# Patient Record
Sex: Female | Born: 1970 | Race: White | Hispanic: No | Smoking: Former smoker
Health system: Southern US, Community
[De-identification: ages and names within clinical notes are randomized; demographics above are authoritative.]

## PROBLEM LIST (undated history)

## (undated) DIAGNOSIS — R87619 Unspecified abnormal cytological findings in specimens from cervix uteri: Secondary | ICD-10-CM

## (undated) DIAGNOSIS — Z803 Family history of malignant neoplasm of breast: Secondary | ICD-10-CM

## (undated) DIAGNOSIS — R51 Headache: Secondary | ICD-10-CM

## (undated) DIAGNOSIS — Z8042 Family history of malignant neoplasm of prostate: Secondary | ICD-10-CM

## (undated) DIAGNOSIS — IMO0002 Reserved for concepts with insufficient information to code with codable children: Secondary | ICD-10-CM

## (undated) DIAGNOSIS — R5383 Other fatigue: Secondary | ICD-10-CM

## (undated) DIAGNOSIS — Z808 Family history of malignant neoplasm of other organs or systems: Secondary | ICD-10-CM

## (undated) HISTORY — PX: EYE SURGERY: SHX253

## (undated) HISTORY — DX: Family history of malignant neoplasm of prostate: Z80.42

## (undated) HISTORY — PX: STRABISMUS SURGERY: SHX218

## (undated) HISTORY — PX: DIAGNOSTIC LAPAROSCOPY: SUR761

## (undated) HISTORY — DX: Family history of malignant neoplasm of other organs or systems: Z80.8

## (undated) HISTORY — DX: Family history of malignant neoplasm of breast: Z80.3

## (undated) HISTORY — PX: BREAST BIOPSY: SHX20

## (undated) HISTORY — DX: Headache: R51

## (undated) HISTORY — DX: Reserved for concepts with insufficient information to code with codable children: IMO0002

## (undated) HISTORY — DX: Other fatigue: R53.83

## (undated) HISTORY — DX: Unspecified abnormal cytological findings in specimens from cervix uteri: R87.619

## (undated) HISTORY — PX: TONSILLECTOMY: SUR1361

---

## 1997-11-28 ENCOUNTER — Other Ambulatory Visit: Admission: RE | Admit: 1997-11-28 | Discharge: 1997-11-28 | Payer: Self-pay | Admitting: *Deleted

## 1998-04-23 ENCOUNTER — Other Ambulatory Visit: Admission: RE | Admit: 1998-04-23 | Discharge: 1998-04-23 | Payer: Self-pay | Admitting: *Deleted

## 1998-11-13 ENCOUNTER — Other Ambulatory Visit: Admission: RE | Admit: 1998-11-13 | Discharge: 1998-11-13 | Payer: Self-pay | Admitting: *Deleted

## 1999-10-07 ENCOUNTER — Other Ambulatory Visit: Admission: RE | Admit: 1999-10-07 | Discharge: 1999-10-07 | Payer: Self-pay | Admitting: Obstetrics and Gynecology

## 2000-04-15 ENCOUNTER — Other Ambulatory Visit: Admission: RE | Admit: 2000-04-15 | Discharge: 2000-04-15 | Payer: Self-pay | Admitting: *Deleted

## 2000-04-18 ENCOUNTER — Inpatient Hospital Stay (HOSPITAL_COMMUNITY): Admission: AD | Admit: 2000-04-18 | Discharge: 2000-04-20 | Payer: Self-pay | Admitting: Obstetrics and Gynecology

## 2000-04-21 ENCOUNTER — Encounter (HOSPITAL_COMMUNITY): Admission: RE | Admit: 2000-04-21 | Discharge: 2000-07-20 | Payer: Self-pay | Admitting: Obstetrics and Gynecology

## 2000-06-01 ENCOUNTER — Other Ambulatory Visit: Admission: RE | Admit: 2000-06-01 | Discharge: 2000-06-01 | Payer: Self-pay | Admitting: *Deleted

## 2000-07-22 ENCOUNTER — Encounter: Admission: RE | Admit: 2000-07-22 | Discharge: 2000-10-20 | Payer: Self-pay | Admitting: Obstetrics and Gynecology

## 2000-11-19 ENCOUNTER — Encounter: Admission: RE | Admit: 2000-11-19 | Discharge: 2000-12-19 | Payer: Self-pay | Admitting: Obstetrics and Gynecology

## 2000-11-22 ENCOUNTER — Other Ambulatory Visit: Admission: RE | Admit: 2000-11-22 | Discharge: 2000-11-22 | Payer: Self-pay | Admitting: Obstetrics and Gynecology

## 2000-12-20 ENCOUNTER — Encounter: Admission: RE | Admit: 2000-12-20 | Discharge: 2001-01-19 | Payer: Self-pay | Admitting: Obstetrics and Gynecology

## 2001-02-19 ENCOUNTER — Encounter: Admission: RE | Admit: 2001-02-19 | Discharge: 2001-03-21 | Payer: Self-pay | Admitting: Obstetrics and Gynecology

## 2001-03-22 ENCOUNTER — Encounter: Admission: RE | Admit: 2001-03-22 | Discharge: 2001-04-21 | Payer: Self-pay | Admitting: Obstetrics and Gynecology

## 2001-05-17 ENCOUNTER — Other Ambulatory Visit: Admission: RE | Admit: 2001-05-17 | Discharge: 2001-05-17 | Payer: Self-pay | Admitting: Obstetrics and Gynecology

## 2002-03-28 ENCOUNTER — Inpatient Hospital Stay (HOSPITAL_COMMUNITY): Admission: AD | Admit: 2002-03-28 | Discharge: 2002-03-28 | Payer: Self-pay | Admitting: Obstetrics and Gynecology

## 2002-03-29 ENCOUNTER — Encounter: Admission: RE | Admit: 2002-03-29 | Discharge: 2002-04-28 | Payer: Self-pay | Admitting: Obstetrics and Gynecology

## 2002-05-29 ENCOUNTER — Encounter: Admission: RE | Admit: 2002-05-29 | Discharge: 2002-06-28 | Payer: Self-pay | Admitting: Obstetrics and Gynecology

## 2002-07-13 ENCOUNTER — Other Ambulatory Visit: Admission: RE | Admit: 2002-07-13 | Discharge: 2002-07-13 | Payer: Self-pay | Admitting: *Deleted

## 2002-07-29 ENCOUNTER — Encounter: Admission: RE | Admit: 2002-07-29 | Discharge: 2002-08-28 | Payer: Self-pay | Admitting: Obstetrics and Gynecology

## 2002-08-29 ENCOUNTER — Encounter: Admission: RE | Admit: 2002-08-29 | Discharge: 2002-09-28 | Payer: Self-pay | Admitting: Obstetrics and Gynecology

## 2002-10-28 ENCOUNTER — Encounter: Admission: RE | Admit: 2002-10-28 | Discharge: 2002-11-27 | Payer: Self-pay | Admitting: Obstetrics and Gynecology

## 2002-12-28 ENCOUNTER — Encounter: Admission: RE | Admit: 2002-12-28 | Discharge: 2003-01-27 | Payer: Self-pay | Admitting: Obstetrics and Gynecology

## 2003-09-26 ENCOUNTER — Other Ambulatory Visit: Admission: RE | Admit: 2003-09-26 | Discharge: 2003-09-26 | Payer: Self-pay | Admitting: Obstetrics and Gynecology

## 2004-10-23 ENCOUNTER — Other Ambulatory Visit: Admission: RE | Admit: 2004-10-23 | Discharge: 2004-10-23 | Payer: Self-pay | Admitting: Obstetrics and Gynecology

## 2005-10-29 ENCOUNTER — Other Ambulatory Visit: Admission: RE | Admit: 2005-10-29 | Discharge: 2005-10-29 | Payer: Self-pay | Admitting: Obstetrics and Gynecology

## 2006-04-21 ENCOUNTER — Other Ambulatory Visit: Admission: RE | Admit: 2006-04-21 | Discharge: 2006-04-21 | Payer: Self-pay | Admitting: Obstetrics and Gynecology

## 2011-01-28 ENCOUNTER — Other Ambulatory Visit: Payer: Self-pay | Admitting: Obstetrics and Gynecology

## 2011-01-28 DIAGNOSIS — Z1231 Encounter for screening mammogram for malignant neoplasm of breast: Secondary | ICD-10-CM

## 2011-02-10 ENCOUNTER — Ambulatory Visit
Admission: RE | Admit: 2011-02-10 | Discharge: 2011-02-10 | Disposition: A | Discharge status: Home / Self Care | Payer: BC Managed Care – PPO | Source: Ambulatory Visit | Attending: Obstetrics and Gynecology | Admitting: Obstetrics and Gynecology

## 2011-02-10 DIAGNOSIS — Z1231 Encounter for screening mammogram for malignant neoplasm of breast: Secondary | ICD-10-CM

## 2012-02-10 ENCOUNTER — Other Ambulatory Visit: Payer: Self-pay | Admitting: Obstetrics and Gynecology

## 2012-02-10 DIAGNOSIS — Z1231 Encounter for screening mammogram for malignant neoplasm of breast: Secondary | ICD-10-CM

## 2012-02-16 ENCOUNTER — Ambulatory Visit (INDEPENDENT_AMBULATORY_CARE_PROVIDER_SITE_OTHER): Payer: BC Managed Care – PPO

## 2012-02-16 DIAGNOSIS — Z1231 Encounter for screening mammogram for malignant neoplasm of breast: Secondary | ICD-10-CM

## 2012-03-01 ENCOUNTER — Ambulatory Visit (INDEPENDENT_AMBULATORY_CARE_PROVIDER_SITE_OTHER): Payer: BC Managed Care – PPO | Admitting: Obstetrics and Gynecology

## 2012-03-01 ENCOUNTER — Encounter: Payer: Self-pay | Admitting: Obstetrics and Gynecology

## 2012-03-01 VITALS — BP 96/62 | Resp 16 | Ht 62.5 in | Wt 142.0 lb

## 2012-03-01 DIAGNOSIS — Z124 Encounter for screening for malignant neoplasm of cervix: Secondary | ICD-10-CM

## 2012-03-01 NOTE — Progress Notes (Signed)
Regular Periods: no Mammogram: yes  Monthly Breast Ex.: no Exercise: yes  Tetanus < 10 years: yes Seatbelts: yes  NI. Bladder Functn.: yes Abuse at home: no  Daily BM's: yes Stressful Work: yes "Sometimes"   Healthy Diet: yes Sigmoid-Colonoscopy: Never  Calcium: no Medical problems this year: None   LAST PAP:01/2009 "WNL"  Contraception: Mirena  Mammogram:  02/2012 pt has not got Results back yet.   PCP: N/A  PMH: No changes  FMH: No Changes  Last Bone Scan: Never   ANNUAL GYNECOLOGIC EXAMINATION   Anna Reid is a 41 y.o. female, No obstetric history on file., who presents for an annual exam. See above. The patient had a Mirena IUD placed in 2011.  She is not sexually active.  She does not have cycles.  Prior Hysterectomy: No    History   Social History  . Marital Status: Divorced    Spouse Name: N/A    Number of Children: N/A  . Years of Education: N/A   Social History Main Topics  . Smoking status: Former Games developer  . Smokeless tobacco: Never Used  . Alcohol Use: No  . Drug Use: No  . Sexually Active: None   Other Topics Concern  . None   Social History Narrative  . None    Menstrual cycle:   LMP: No LMP recorded. Patient is not currently having periods (Reason: IUD).             The following portions of the patient's history were reviewed and updated as appropriate: allergies, current medications, past family history, past medical history, past social history, past surgical history and problem list.  Review of Systems Pertinent items are noted in HPI. Breast:Negative for breast lump,nipple discharge or nipple retraction Gastrointestinal: Negative for abdominal pain, change in bowel habits or rectal bleeding Urinary: some stress urinary incontinence   Objective:    BP 96/62  Resp 16  Ht 5' 2.5" (1.588 m)  Wt 142 lb (64.411 kg)  BMI 25.56 kg/m2    Weight:  Wt Readings from Last 1 Encounters:  03/01/12 142 lb (64.411 kg)          BMI: Body  mass index is 25.56 kg/(m^2).  General Appearance: Alert, appropriate appearance for age. No acute distress HEENT: Grossly normal Neck / Thyroid: Supple, no masses, nodes or enlargement Lungs: clear to auscultation bilaterally Back: No CVA tenderness Breast Exam: No masses or nodes.No dimpling, nipple retraction or discharge. Cardiovascular: Regular rate and rhythm. S1, S2, no murmur Gastrointestinal: Soft, non-tender, no masses or organomegaly  ++++++++++++++++++++++++++++++++++++++++++++++++++++++++  Pelvic Exam: External genitalia: normal general appearance Vaginal: normal without tenderness, induration or masses and relaxation: Yes Cervix: normal appearance Adnexa: normal bimanual exam Uterus: normal size, shape, and consistency Rectovaginal: normal rectal, no masses  ++++++++++++++++++++++++++++++++++++++++++++++++++++++++  Lymphatic Exam: Non-palpable nodes in neck, clavicular, axillary, or inguinal regions Neurologic: Normal speech, no tremor  Psychiatric: Alert and oriented, appropriate affect.   Wet Prep:   not applicable Urinalysis:  not applicable UPT:           Not done   Assessment:    Normal gyn exam   Overweight or obese: Yes   Pelvic relaxation: Yes  Plan:    mammogram pap smear return annually or prn Contraception:IUD    Medications prescribed: none  STD screen request: No   The updated Pap smear screening guidelines were discussed with the patient. The patient requested that I obtain a Pap smear: Yes.  Kegel exercises discussed: Yes.  Proper diet and regular exercise were reviewed.  Annual mammograms recommended starting at age 46. Proper breast care was discussed.  Regular health maintenance was reviewed.  Sleep hygiene was discussed.  Mylinda Latina.D.

## 2012-03-03 LAB — PAP IG W/ RFLX HPV ASCU

## 2012-03-03 NOTE — Progress Notes (Signed)
Quick Note:  Send atypical Pap smear letter with a note to return for repeat Pap in six months. ______ 

## 2012-03-08 ENCOUNTER — Encounter: Payer: Self-pay | Admitting: Obstetrics and Gynecology

## 2012-04-04 ENCOUNTER — Telehealth: Payer: Self-pay | Admitting: Obstetrics and Gynecology

## 2012-04-04 NOTE — Telephone Encounter (Signed)
Patient called.  HPV discussed.  Told to schedule colposcopy.  Dr. Stefano Gaul

## 2012-04-05 ENCOUNTER — Telehealth: Payer: Self-pay | Admitting: Obstetrics and Gynecology

## 2012-04-05 NOTE — Telephone Encounter (Signed)
Spoke with pt rgd msg explained colpo to pt pt has appt 04/25/12 at 10:15 with AVS pt voice understanding

## 2012-04-25 ENCOUNTER — Ambulatory Visit (INDEPENDENT_AMBULATORY_CARE_PROVIDER_SITE_OTHER): Payer: BC Managed Care – PPO | Admitting: Obstetrics and Gynecology

## 2012-04-25 ENCOUNTER — Encounter: Payer: Self-pay | Admitting: Obstetrics and Gynecology

## 2012-04-25 VITALS — BP 108/64 | Resp 16 | Ht 62.5 in | Wt 148.0 lb

## 2012-04-25 DIAGNOSIS — B977 Papillomavirus as the cause of diseases classified elsewhere: Secondary | ICD-10-CM

## 2012-04-25 DIAGNOSIS — R6889 Other general symptoms and signs: Secondary | ICD-10-CM

## 2012-04-25 DIAGNOSIS — R5383 Other fatigue: Secondary | ICD-10-CM | POA: Insufficient documentation

## 2012-04-25 LAB — POCT URINE PREGNANCY: Preg Test, Ur: NEGATIVE

## 2012-04-25 NOTE — Progress Notes (Addendum)
HISTORY OF PRESENT ILLNESS  Anna Reid is a 41 y.o. year old female,G2P2, who presents for a problem visit. The patient has a history of abnormal Pap smears.  Colposcopy and biopsies were performed in 2007. CIN I was diagnosed.  Subjective:  Questions HPV.  Objective:  BP 108/64  Resp 16  Ht 5' 2.5" (1.588 m)  Wt 148 lb (67.132 kg)  BMI 26.64 kg/m2   GI: soft and nontender  External genitalia: normal general appearance Vaginal: normal without tenderness, induration or masses and relaxation noted Cervix: see colposcopy note Adnexa: normal bimanual exam Uterus: upper limits normal size  Urine pregnancy test: Negative  COLPOSCOPY NOTE:  The colposcopy procedure was explained.  The patient's questions were answered. A speculum exam was performed.  The cervix was prepped with acetic acid and Hurricaine gel.  The cervix was evaluated using a white light and the green filter. Findings: punctations at 6:00, and white epithelium at 1:00 .  The endocervical canal was clear.  No lesions were seen.  Biopsies obtained: 6:00 and 1:00.  Hemostasis was adequate.  An endocervical curettage was performed.  Again, hemostasis was adequate. The procedure was terminated.  The patient tolerated her procedure well.  The specimens were sent to pathology.  Assessment:  Ascus Pap.  HPV.  History of CIN-1  Plan:  Biopsy at 6:00, 1:00, and ECC sent to pathology.  HPV discussed  Return to office in 2 week(s).   Leonard Schwartz M.D.  04/25/2012 11:06 AM    Previous Pap Smear: EPITHELIAL CELL ABNORMALITY: SQUAMOUS CELLS ATYPICAL SQUAMOUS CELLS OF UNDETERMINED SIGNIFICANCE (ASC-US).  Previous Colposcopy: 2007 Referred From: [facility]  LMP: No cycles Contraception: Mirena G,P: 2;2

## 2012-04-27 LAB — PATHOLOGY

## 2012-05-03 ENCOUNTER — Telehealth: Payer: Self-pay | Admitting: Obstetrics and Gynecology

## 2012-05-06 ENCOUNTER — Telehealth: Payer: Self-pay

## 2012-05-06 NOTE — Telephone Encounter (Signed)
Message routed to EP regarding pt calling for her results. AVS has not reviewed these results. Not sure who pt spoke to on Tues. No documentation is noted that anyone told her that would let AVS know she was awaiting results. Will try to see if EP, PA can review pt results.  [facility] CMA

## 2012-05-06 NOTE — Telephone Encounter (Signed)
LM for pt to c/b regarding her message. 

## 2012-05-06 NOTE — Telephone Encounter (Signed)
Spoke with pt requesting test results. Informed pt per protocol the provider has to sign off on the results before we can release them. Pt upset states someone told her Tuesday they will send Dr. AVS a message stating she was waiting for lab results but hasn't heard anything yet; however, there isn't any notes indicating that. Informed pt I will consult with his assistant regarding this issue.

## 2012-05-07 NOTE — Telephone Encounter (Signed)
Patient with recent colposcopy, is scheduled to follow up with Dr. Stefano Gaul on 05/11/12.  Though her colposcopy biopsies did not show any cancer, she will need to discuss findings with Dr. Stefano Gaul and arrange for follow up.  Auren Valdes, PA-C

## 2012-05-11 ENCOUNTER — Ambulatory Visit (INDEPENDENT_AMBULATORY_CARE_PROVIDER_SITE_OTHER): Payer: BC Managed Care – PPO | Admitting: Obstetrics and Gynecology

## 2012-05-11 ENCOUNTER — Encounter: Payer: Self-pay | Admitting: Obstetrics and Gynecology

## 2012-05-11 VITALS — BP 120/70 | Ht 62.0 in | Wt 146.0 lb

## 2012-05-11 DIAGNOSIS — N870 Mild cervical dysplasia: Secondary | ICD-10-CM

## 2012-05-11 DIAGNOSIS — B977 Papillomavirus as the cause of diseases classified elsewhere: Secondary | ICD-10-CM

## 2012-05-11 NOTE — Progress Notes (Signed)
HISTORY OF PRESENT ILLNESS  [name] is a 41 y.o. year old female,G2P2, who presents for a problem visit. The patient has a history of recurrent ASCUS Pap smears.  Her HPV test is positive high-risk virus.  Colposcopy and biopsies were performed that showed CIN-1 and HPV.  Subjective:  The patient is emotionally upset today.  She is crying during the discussion.  She says she is upset about her diagnosis.  Objective:  BP 120/70  Ht 5\' 2"  (1.575 m)  Wt 146 lb (66.225 kg)  BMI 26.70 kg/m2   General: moderate distress and very emotional  Exam deferred.  Assessment:  CIN-1  High-risk HPV  Emotionally upset  Plan:  The natural history of CIN and HPV were discussed again.  We discussed her current home situation.  The patient elects to repeat her Pap smear at her next annual exam.  The patient was told to call if anything else bothers her so that we can discuss how she is doing.  25 min. Office visit with greater than 50% of the visit being face-to-face with the patient.  Leonard Schwartz M.D.  05/11/2012 9:37 AM

## 2013-02-20 ENCOUNTER — Other Ambulatory Visit: Payer: Self-pay | Admitting: Obstetrics and Gynecology

## 2013-02-20 DIAGNOSIS — Z1231 Encounter for screening mammogram for malignant neoplasm of breast: Secondary | ICD-10-CM

## 2013-02-28 ENCOUNTER — Ambulatory Visit (INDEPENDENT_AMBULATORY_CARE_PROVIDER_SITE_OTHER): Payer: BC Managed Care – PPO

## 2013-02-28 DIAGNOSIS — Z1231 Encounter for screening mammogram for malignant neoplasm of breast: Secondary | ICD-10-CM

## 2014-01-29 ENCOUNTER — Other Ambulatory Visit: Payer: Self-pay | Admitting: Obstetrics and Gynecology

## 2014-01-29 DIAGNOSIS — Z139 Encounter for screening, unspecified: Secondary | ICD-10-CM

## 2014-03-06 ENCOUNTER — Ambulatory Visit: Payer: Self-pay

## 2014-03-06 ENCOUNTER — Ambulatory Visit (INDEPENDENT_AMBULATORY_CARE_PROVIDER_SITE_OTHER): Payer: BC Managed Care – PPO

## 2014-03-06 DIAGNOSIS — Z1231 Encounter for screening mammogram for malignant neoplasm of breast: Secondary | ICD-10-CM

## 2014-03-06 DIAGNOSIS — Z139 Encounter for screening, unspecified: Secondary | ICD-10-CM

## 2014-05-07 ENCOUNTER — Encounter: Payer: Self-pay | Admitting: Obstetrics and Gynecology

## 2015-02-14 ENCOUNTER — Other Ambulatory Visit: Payer: Self-pay | Admitting: Obstetrics and Gynecology

## 2015-02-14 DIAGNOSIS — Z1231 Encounter for screening mammogram for malignant neoplasm of breast: Secondary | ICD-10-CM

## 2015-03-12 ENCOUNTER — Ambulatory Visit (INDEPENDENT_AMBULATORY_CARE_PROVIDER_SITE_OTHER): Payer: BLUE CROSS/BLUE SHIELD

## 2015-03-12 DIAGNOSIS — Z1231 Encounter for screening mammogram for malignant neoplasm of breast: Secondary | ICD-10-CM

## 2015-07-07 DIAGNOSIS — C50919 Malignant neoplasm of unspecified site of unspecified female breast: Secondary | ICD-10-CM

## 2015-07-07 HISTORY — DX: Malignant neoplasm of unspecified site of unspecified female breast: C50.919

## 2016-02-10 ENCOUNTER — Other Ambulatory Visit: Payer: Self-pay | Admitting: Obstetrics and Gynecology

## 2016-02-10 DIAGNOSIS — Z139 Encounter for screening, unspecified: Secondary | ICD-10-CM

## 2016-03-18 ENCOUNTER — Ambulatory Visit (INDEPENDENT_AMBULATORY_CARE_PROVIDER_SITE_OTHER): Payer: BLUE CROSS/BLUE SHIELD

## 2016-03-18 DIAGNOSIS — Z139 Encounter for screening, unspecified: Secondary | ICD-10-CM

## 2016-03-18 DIAGNOSIS — Z1231 Encounter for screening mammogram for malignant neoplasm of breast: Secondary | ICD-10-CM

## 2016-03-25 ENCOUNTER — Other Ambulatory Visit: Payer: Self-pay | Admitting: Obstetrics and Gynecology

## 2016-03-25 DIAGNOSIS — R928 Other abnormal and inconclusive findings on diagnostic imaging of breast: Secondary | ICD-10-CM

## 2016-03-27 ENCOUNTER — Ambulatory Visit
Admission: RE | Admit: 2016-03-27 | Discharge: 2016-03-27 | Disposition: A | Discharge status: Home / Self Care | Payer: BLUE CROSS/BLUE SHIELD | Source: Ambulatory Visit | Attending: Obstetrics and Gynecology | Admitting: Obstetrics and Gynecology

## 2016-03-27 ENCOUNTER — Other Ambulatory Visit: Payer: Self-pay | Admitting: Obstetrics and Gynecology

## 2016-03-27 DIAGNOSIS — R928 Other abnormal and inconclusive findings on diagnostic imaging of breast: Secondary | ICD-10-CM

## 2016-03-31 ENCOUNTER — Ambulatory Visit
Admission: RE | Admit: 2016-03-31 | Discharge: 2016-03-31 | Disposition: A | Discharge status: Home / Self Care | Payer: BLUE CROSS/BLUE SHIELD | Source: Ambulatory Visit | Attending: Obstetrics and Gynecology | Admitting: Obstetrics and Gynecology

## 2016-03-31 DIAGNOSIS — R928 Other abnormal and inconclusive findings on diagnostic imaging of breast: Secondary | ICD-10-CM

## 2016-04-01 ENCOUNTER — Telehealth: Payer: Self-pay | Admitting: *Deleted

## 2016-04-01 DIAGNOSIS — C50211 Malignant neoplasm of upper-inner quadrant of right female breast: Secondary | ICD-10-CM | POA: Insufficient documentation

## 2016-04-01 NOTE — Telephone Encounter (Signed)
Confirmed [facility] for 04/08/16 at 815am .  Instructions and contact information given.

## 2016-04-08 ENCOUNTER — Ambulatory Visit (HOSPITAL_BASED_OUTPATIENT_CLINIC_OR_DEPARTMENT_OTHER): Payer: BLUE CROSS/BLUE SHIELD | Admitting: Hematology and Oncology

## 2016-04-08 ENCOUNTER — Other Ambulatory Visit: Payer: BLUE CROSS/BLUE SHIELD

## 2016-04-08 ENCOUNTER — Ambulatory Visit: Payer: BLUE CROSS/BLUE SHIELD | Admitting: Physical Therapy

## 2016-04-08 ENCOUNTER — Encounter: Payer: Self-pay | Admitting: Genetic Counselor

## 2016-04-08 ENCOUNTER — Ambulatory Visit
Admission: RE | Admit: 2016-04-08 | Discharge: 2016-04-08 | Disposition: A | Discharge status: Home / Self Care | Payer: BLUE CROSS/BLUE SHIELD | Source: Ambulatory Visit | Attending: Radiation Oncology | Admitting: Radiation Oncology

## 2016-04-08 ENCOUNTER — Ambulatory Visit (HOSPITAL_BASED_OUTPATIENT_CLINIC_OR_DEPARTMENT_OTHER): Payer: BLUE CROSS/BLUE SHIELD | Admitting: Genetic Counselor

## 2016-04-08 ENCOUNTER — Encounter: Payer: Self-pay | Admitting: Hematology and Oncology

## 2016-04-08 ENCOUNTER — Encounter: Payer: Self-pay | Admitting: *Deleted

## 2016-04-08 ENCOUNTER — Other Ambulatory Visit (HOSPITAL_BASED_OUTPATIENT_CLINIC_OR_DEPARTMENT_OTHER): Payer: BLUE CROSS/BLUE SHIELD

## 2016-04-08 DIAGNOSIS — Z170 Estrogen receptor positive status [ER+]: Secondary | ICD-10-CM

## 2016-04-08 DIAGNOSIS — D0511 Intraductal carcinoma in situ of right breast: Secondary | ICD-10-CM | POA: Diagnosis not present

## 2016-04-08 DIAGNOSIS — C50211 Malignant neoplasm of upper-inner quadrant of right female breast: Secondary | ICD-10-CM

## 2016-04-08 DIAGNOSIS — Z803 Family history of malignant neoplasm of breast: Secondary | ICD-10-CM

## 2016-04-08 DIAGNOSIS — Z8042 Family history of malignant neoplasm of prostate: Secondary | ICD-10-CM | POA: Diagnosis not present

## 2016-04-08 DIAGNOSIS — Z801 Family history of malignant neoplasm of trachea, bronchus and lung: Secondary | ICD-10-CM | POA: Diagnosis not present

## 2016-04-08 DIAGNOSIS — Z808 Family history of malignant neoplasm of other organs or systems: Secondary | ICD-10-CM

## 2016-04-08 LAB — CBC WITH DIFFERENTIAL/PLATELET
BASO%: 0.7 % (ref 0.0–2.0)
Basophils Absolute: 0 10*3/uL (ref 0.0–0.1)
EOS ABS: 0.2 10*3/uL (ref 0.0–0.5)
EOS%: 4.3 % (ref 0.0–7.0)
HCT: 42.7 % (ref 34.8–46.6)
HEMOGLOBIN: 14.1 g/dL (ref 11.6–15.9)
LYMPH#: 1.3 10*3/uL (ref 0.9–3.3)
LYMPH%: 34.6 % (ref 14.0–49.7)
MCH: 30.3 pg (ref 25.1–34.0)
MCHC: 33.1 g/dL (ref 31.5–36.0)
MCV: 91.5 fL (ref 79.5–101.0)
MONO#: 0.4 10*3/uL (ref 0.1–0.9)
MONO%: 9.3 % (ref 0.0–14.0)
NEUT%: 51.1 % (ref 38.4–76.8)
NEUTROS ABS: 2 10*3/uL (ref 1.5–6.5)
Platelets: 205 10*3/uL (ref 145–400)
RBC: 4.67 10*6/uL (ref 3.70–5.45)
RDW: 12.7 % (ref 11.2–14.5)
WBC: 3.8 10*3/uL — AB (ref 3.9–10.3)

## 2016-04-08 LAB — COMPREHENSIVE METABOLIC PANEL
ALBUMIN: 4.1 g/dL (ref 3.5–5.0)
ALK PHOS: 76 U/L (ref 40–150)
ALT: <9 U/L (ref 0–55)
AST: 15 U/L (ref 5–34)
Anion Gap: 10 mEq/L (ref 3–11)
BILIRUBIN TOTAL: 0.7 mg/dL (ref 0.20–1.20)
BUN: 16.3 mg/dL (ref 7.0–26.0)
CO2: 27 mEq/L (ref 22–29)
Calcium: 9.4 mg/dL (ref 8.4–10.4)
Chloride: 104 mEq/L (ref 98–109)
Creatinine: 1 mg/dL (ref 0.6–1.1)
EGFR: 72 mL/min/{1.73_m2} — AB (ref >90)
GLUCOSE: 91 mg/dL (ref 70–140)
Potassium: 4.2 mEq/L (ref 3.5–5.1)
SODIUM: 141 meq/L (ref 136–145)
TOTAL PROTEIN: 7.3 g/dL (ref 6.4–8.3)

## 2016-04-08 NOTE — Progress Notes (Signed)
Radiation Oncology         [phone] ________________________________  Name: Anna Reid MRN: [id number]  Date: 04/08/2016  DOB: 1970/10/18  CC:Pcp Not In System  No ref. provider found     REFERRING PHYSICIAN: No ref. provider found   DIAGNOSIS: There were no encounter diagnoses.   HISTORY OF PRESENT ILLNESS: Anna Reid is a 45 y.o. female seen in the multidisciplinary breast clinic for a new right breast cancer. The patient was found to have right sided calcifications on a screening mammogram on 03/18/16. She returned for recall diagnostic imaging on 03/27/16 and an ultrasound revealed a 4 mm area of calcifications. No axillary ultrasound was performed. 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 She comes today to discuss treatment options for her care, and specifically with Dr. Lisbeth Renshaw regarding radiotherapy.   PREVIOUS RADIATION THERAPY: No   PAST MEDICAL HISTORY:  Past Medical History:  Diagnosis Date  . Abnormal Pap smear    ASC-US  . Fatigue   . Headache(784.0)        PAST SURGICAL HISTORY: Past Surgical History:  Procedure Laterality Date  . STRABISMUS SURGERY    . TONSILLECTOMY       FAMILY HISTORY:  Family History  Problem Relation Age of Onset  . Brain cancer Mother   . Other Mother     oral cancer  . Lung cancer Maternal Grandfather   . Breast cancer Maternal Aunt   . Other Maternal Uncle     oral cancer     SOCIAL HISTORY:  reports that she has quit smoking. Her smoking use included Cigarettes. She smoked 1.00 pack per day. She has never used smokeless tobacco. She reports that she does not drink alcohol or use drugs. The patient is divorced and resides in [place]. She works for [employer].   ALLERGIES: Penicillins   MEDICATIONS:  Current Outpatient Prescriptions  Medication Sig Dispense Refill  . Methylcobalamin (METHYL B-12 PO) Take 2,500 mcg by mouth as needed.     No current  facility-administered medications for this encounter.      REVIEW OF SYSTEMS: On review of systems, the patient reports that she is doing well overall. She describes her aunt's course with radiation treatment and describes her concerns as this may relate to her. She denies any chest pain, shortness of breath, cough, fevers, chills, night sweats, unintended weight changes. She denies any bowel or bladder disturbances, and denies abdominal pain, nausea or vomiting. She denies any new musculoskeletal or joint aches or pains. A complete review of systems is obtained and is otherwise negative.     PHYSICAL EXAM:  Wt Readings from Last 3 Encounters:  04/08/16 157 lb 1.6 oz (71.3 kg)  05/11/12 146 lb (66.2 kg)  04/25/12 148 lb (67.1 kg)   Temp Readings from Last 3 Encounters:  04/08/16 98.1 F (36.7 C) (Oral)   BP Readings from Last 3 Encounters:  04/08/16 115/61  05/11/12 120/70  04/25/12 108/64   Pulse Readings from Last 3 Encounters:  04/08/16 (!) 55    Pain scale 0/10 In general this is a well appearing Caucasian female in no acute distress. She is alert and oriented x4 and appropriate throughout the examination. HEENT reveals that the patient is normocephalic, atraumatic. EOMs are intact. PERRLA. Skin is intact without any evidence of gross lesions. Cardiovascular exam reveals a regular rate and rhythm, no clicks rubs or murmurs are auscultated. Chest is clear to auscultation bilaterally.  Lymphatic assessment is performed and does not reveal any adenopathy in the cervical, supraclavicular, axillary, or inguinal chains. Abdomen has active bowel sounds in all quadrants and is intact. Bilateral breast exam is performed and reveals post biopsy change of the right breast. No palpable masses are noted. No nipple bleeding or discharge is note. The abdomen is soft, non tender, non distended. Lower extremities are negative for pretibial pitting edema, deep calf tenderness, cyanosis or  clubbing.   ECOG = 0  0 - Asymptomatic (Fully active, able to carry on all predisease activities without restriction)  1 - Symptomatic but completely ambulatory (Restricted in physically strenuous activity but ambulatory and able to carry out work of a light or sedentary nature. For example, light housework, office work)  2 - Symptomatic, <50% in bed during the day (Ambulatory and capable of all self care but unable to carry out any work activities. Up and about more than 50% of waking hours)  3 - Symptomatic, >50% in bed, but not bedbound (Capable of only limited self-care, confined to bed or chair 50% or more of waking hours)  4 - Bedbound (Completely disabled. Cannot carry on any self-care. Totally confined to bed or chair)  5 - Death   Eustace Pen MM, Creech RH, Tormey DC, et al. [phone]). "Toxicity and response criteria of the [facility] Group". [place] Oncol. 5 (6): 649-55    LABORATORY DATA:  Lab Results  Component Value Date   WBC 3.8 (L) 04/08/2016   HGB 14.1 04/08/2016   HCT 42.7 04/08/2016   MCV 91.5 04/08/2016   PLT 205 04/08/2016   Lab Results  Component Value Date   NA 141 04/08/2016   K 4.2 04/08/2016   CO2 27 04/08/2016   Lab Results  Component Value Date   ALT <9 04/08/2016   AST 15 04/08/2016   ALKPHOS 76 04/08/2016   BILITOT 0.70 04/08/2016      RADIOGRAPHY: Mm Digital Diagnostic Unilat R  Result Date: 03/31/2016 CLINICAL DATA:  Status post biopsy right breast EXAM: DIAGNOSTIC RIGHT MAMMOGRAM POST STEREOTACTIC BIOPSY COMPARISON:  Previous exam(s). FINDINGS: Mammographic images were obtained following stereotactic guided biopsy of slight medial right breast calcifications. Cc and lateral views of the right breast demonstrate ribbon biopsy clip in the area of concern. IMPRESSION: Post biopsy mammogram as described. Final Assessment: Post Procedure Mammograms for Marker Placement Electronically Signed   By: Abelardo Diesel M.D.   On:  03/31/2016 09:27   Mm Digital Diagnostic Unilat R  Result Date: 03/27/2016 CLINICAL DATA:  Screening recall for right breast calcifications. EXAM: DIGITAL DIAGNOSTIC RIGHT MAMMOGRAM WITH CAD COMPARISON:  Previous exam(s). ACR Breast Density Category b: There are scattered areas of fibroglandular density. FINDINGS: In the central to slightly medial right breast, middle depth there is a 4 mm group of punctate and amorphous calcifications, best visualized on the CC view. Mammographic images were processed with CAD. IMPRESSION: The 4 mm group of punctate and amorphous calcifications in the right breast are indeterminate. RECOMMENDATION: Stereotactic right breast biopsy is recommended which has been scheduled for 03/31/2016 at 8:30 a.m. I have discussed the findings and recommendations with the patient. Results were also provided in writing at the conclusion of the visit. If applicable, a reminder letter will be sent to the patient regarding the next appointment. BI-RADS CATEGORY  4: Suspicious abnormality - biopsy should be considered. Electronically Signed   By: Ammie Ferrier M.D.   On: 03/27/2016 11:31   Mm Digital Screening Bilateral  Result Date: 03/20/2016 CLINICAL DATA:  Screening. EXAM: DIGITAL SCREENING BILATERAL MAMMOGRAM WITH CAD COMPARISON:  Previous exam(s). ACR Breast Density Category b: There are scattered areas of fibroglandular density. FINDINGS: In the right breast, calcifications warrant further evaluation with magnified views. In the left breast, no findings suspicious for malignancy. Images were processed with CAD. IMPRESSION: Further evaluation is suggested for calcifications in the right breast. RECOMMENDATION: Diagnostic mammogram of the right breast. (Code:[id number]) The patient will be contacted regarding the findings, and additional imaging will be scheduled. BI-RADS CATEGORY  0: Incomplete. Need additional imaging evaluation and/or prior mammograms for comparison. Electronically  Signed   By: Claudie Revering M.D.   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 This was found to be concordant by Dr. Abelardo Diesel. Pathology results were discussed with the patient by telephone. The patient reported doing well after the biopsy with tenderness at the site. Post biopsy instructions and care were reviewed and questions were answered. The patient was encouraged to call [facility] for any additional concerns. The patient was referred to the [facility] at [facility] on April 08, 2016. Pathology results reported by Susa Raring RN, BSN on 04/01/2016. Electronically Signed   By: Abelardo Diesel M.D.   On: 04/01/2016 09:53   Result Date: 04/01/2016 CLINICAL DATA:  Right breast calcifications for biopsy EXAM: RIGHT BREAST STEREOTACTIC CORE NEEDLE BIOPSY COMPARISON:  Previous exams. FINDINGS: The patient and I discussed the procedure of stereotactic-guided biopsy including benefits and alternatives. We discussed the high likelihood of a successful procedure. We discussed the risks of the procedure including infection, bleeding, tissue injury, clip migration, and inadequate sampling. Informed written consent was given. The usual time out protocol was performed immediately prior to the procedure. Using sterile technique and 1% Lidocaine as local anesthetic, under stereotactic guidance, a 9 gauge 3D tomographic stereotactic vacuum assisted device was used to perform core needle biopsy of calcifications in the medial right breast using a cranial approach. Specimen radiograph was performed showing inclusion of calcifications of concern. Specimens with calcifications are identified for pathology. At the  conclusion of the procedure, a ribbon tissue marker clip was deployed into the biopsy cavity. Follow-up 2-view mammogram was performed and dictated separately. IMPRESSION: Stereotactic-guided biopsy of right breast. No apparent complications. Electronically Signed: By: Abelardo Diesel M.D. On: 03/31/2016 09:03       IMPRESSION/PLAN: 1. 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 Dr. Lisbeth Renshaw discusses the pathology findings and reviews the nature of in situ disease. The consensus from the breast conference include breast conservation with lumpectomy versus mastectomy versus enrollment in the COMET trial looking at observation for low grade disease. The patient is not interested in observation, and rather would like a genetic counseling evaluation and would like to make her decision for surgical intervention based on her genetic testing results. If she does undergo a lumpectomy, we discussed the utility of radiation therapy. If she went this pathway, she would be encouraged to receive 33 fractions of external radiotherapy to the breast over 6 1/2 weeks time, followed by antiestrogen therapy. We discussed the risks, benefits, short, and long term effects of radiotherapy, and the patient is interested in proceeding provided she does not undergo  mastectomy. Dr. Lisbeth Renshaw discusses the delivery and logistics of radiotherapy. We will see her back about 2 weeks after surgery to move forward with the simulation and planning process and anticipate starting radiotherapy about 4 weeks after surgery.  2. Possible genetic predisposition to malignancy. The patient is interested in meeting with genetic counseling, a referral has been placed by Dr. Lucia Gaskins.  The above documentation reflects my direct findings during this shared patient visit. Please see the separate note by Dr. Lisbeth Renshaw on this date for the remainder of the patient's plan of care.    Carola Rhine, PAC

## 2016-04-08 NOTE — Progress Notes (Signed)
[place] CONSULT NOTE  Patient Care Team: Pcp Not In System as PCP - General Ena Dawley, MD as Consulting Physician (Obstetrics and Gynecology) Alphonsa Overall, MD as Consulting Physician (General Surgery) Nicholas Lose, MD as Consulting Physician (Hematology and Oncology) Kyung Rudd, MD as Consulting Physician (Radiation Oncology)  CHIEF COMPLAINTS/PURPOSE OF CONSULTATION:  Newly diagnosed right breast DCIS  HISTORY OF PRESENTING ILLNESS:  Anna Reid 45 y.o. female is here because of recent diagnosis of right breast DCIS. Patient had a routine screening mammogram that revealed calcifications in the right breast measuring 4 mm in size. Subsequently underwent biopsy that revealed low to intermediate grade DCIS with calcifications that was ER/PR positive. She was presented this morning in the multidisciplinary tumor board and she is here today accompanied by her sister to discuss the treatment plan.  I reviewed her records extensively and collaborated the history with the patient.  SUMMARY OF ONCOLOGIC HISTORY:   Breast cancer of upper-inner quadrant of right female breast ([place])   03/31/2016 Initial Diagnosis    Right breast biopsy: DCIS with calcifications ER 100%, PR 95%, low intermediate grade, Tis N0 stage 0; screening mammogram revealed right breast calcifications 4 mm in size      MEDICAL HISTORY:  Past Medical History:  Diagnosis Date  . Abnormal Pap smear    ASC-US  . Family history of brain cancer   . Family history of breast cancer   . Family history of prostate cancer   . Fatigue   . Headache(784.0)     SURGICAL HISTORY: Past Surgical History:  Procedure Laterality Date  . STRABISMUS SURGERY    . TONSILLECTOMY      SOCIAL HISTORY: Social History   Social History  . Marital status: Divorced    Spouse name: N/A  . Number of children: 2  . Years of education: N/A   Occupational History  . Not on file.   Social History Main Topics   . Smoking status: Former Smoker    Packs/day: 1.00    Types: Cigarettes  . Smokeless tobacco: Never Used  . Alcohol use No  . Drug use: No  . Sexual activity: Yes    Birth control/ protection: IUD   Other Topics Concern  . Not on file   Social History Narrative  . No narrative on file    FAMILY HISTORY: Family History  Problem Relation Age of Onset  . Brain cancer Mother 66    glioblastoma  . Other Mother     oral cancer  . Lung cancer Maternal Grandfather   . Breast cancer Maternal Aunt 74  . Other Maternal Uncle     oral cancer  . Dementia Maternal Grandmother   . Diabetes Paternal Grandmother   . Congestive Heart Failure Paternal Grandmother   . Prostate cancer Paternal Grandfather   . Other Maternal Uncle     oral cancer    ALLERGIES:  is allergic to penicillins.  MEDICATIONS:  Current Outpatient Prescriptions  Medication Sig Dispense Refill  . Methylcobalamin (METHYL B-12 PO) Take 2,500 mcg by mouth as needed.     No current facility-administered medications for this visit.     REVIEW OF SYSTEMS:   Constitutional: Denies fevers, chills or abnormal night sweats Eyes: Denies blurriness of vision, double vision or watery eyes Ears, nose, mouth, throat, and face: Denies mucositis or sore throat Respiratory: Denies cough, dyspnea or wheezes Cardiovascular: Denies palpitation, chest discomfort or lower extremity swelling Gastrointestinal:  Denies nausea, heartburn or  change in bowel habits Skin: Denies abnormal skin rashes Lymphatics: Denies new lymphadenopathy or easy bruising Neurological:Denies numbness, tingling or new weaknesses Behavioral/Psych: Mood is stable, no new changes  Breast:  Denies any palpable lumps or discharge All other systems were reviewed with the patient and are negative.  PHYSICAL EXAMINATION: ECOG PERFORMANCE STATUS: 0 - Asymptomatic  Vitals:   04/08/16 0847  BP: 115/61  Pulse: (!) 55  Resp: 18  Temp: 98.1 F (36.7 C)    Filed Weights   04/08/16 0847  Weight: 157 lb 1.6 oz (71.3 kg)    GENERAL:alert, no distress and comfortable SKIN: skin color, texture, turgor are normal, no rashes or significant lesions EYES: normal, conjunctiva are pink and non-injected, sclera clear OROPHARYNX:no exudate, no erythema and lips, buccal mucosa, and tongue normal  NECK: supple, thyroid normal size, non-tender, without nodularity LYMPH:  no palpable lymphadenopathy in the cervical, axillary or inguinal LUNGS: clear to auscultation and percussion with normal breathing effort HEART: regular rate & rhythm and no murmurs and no lower extremity edema ABDOMEN:abdomen soft, non-tender and normal bowel sounds Musculoskeletal:no cyanosis of digits and no clubbing  PSYCH: alert & oriented x 3 with fluent speech NEURO: no focal motor/sensory deficits BREAST: No palpable nodules in breast. No palpable axillary or supraclavicular lymphadenopathy (exam performed in the presence of a chaperone)   LABORATORY DATA:  I have reviewed the data as listed Lab Results  Component Value Date   WBC 3.8 (L) 04/08/2016   HGB 14.1 04/08/2016   HCT 42.7 04/08/2016   MCV 91.5 04/08/2016   PLT 205 04/08/2016   Lab Results  Component Value Date   NA 141 04/08/2016   K 4.2 04/08/2016   CO2 27 04/08/2016    RADIOGRAPHIC STUDIES: I have personally reviewed the radiological reports and agreed with the findings in the report.  ASSESSMENT AND PLAN:  Breast cancer of upper-inner quadrant of right female breast ([place]) 03/31/2016 Right breast biopsy: DCIS with calcifications ER 100%, PR 95%, low intermediate grade, Tis N0 stage 0; screening mammogram revealed right breast calcifications 4 mm in size  Pathology review: I discussed with the patient the difference between DCIS and invasive breast cancer. It is considered a precancerous lesion. DCIS is classified as a 0. It is generally detected through mammograms as calcifications. We discussed the  significance of grades and its impact on prognosis. We also discussed the importance of ER and PR receptors and their implications to adjuvant treatment options. Prognosis of DCIS dependence on grade, comedo necrosis. It is anticipated that if not treated, 10-30% of DCIS can develop into invasive breast cancer.  Recommendation: 1. Genetic counseling 2. if no genetic mutations were found, she could participate in COMET trial AFT 25 COMET Phase 3 clinical trial for low risk DCIS grade 1/2 PR positive, age greater than 57 randomized to surgery +/- radiation, +/- endocrine therapy versus active surveillance with +/- endocrine therapy surveillance with mammograms every 6 months for 5 years;patient's have option to decline elevated arm and still be followed on study   Standard of care would be 1. Breast conserving surgery 2. Followed by adjuvant radiation therapy 3. Followed by antiestrogen therapy with tamoxifen 5 years  Tamoxifen counseling: We discussed the risks and benefits of tamoxifen. These include but not limited to insomnia, hot flashes, mood changes, vaginal dryness, and weight gain. Although rare, serious side effects including endometrial cancer, risk of blood clots were also discussed. We strongly believe that the benefits far outweigh the risks. Patient  understands these risks and consented to starting treatment. Planned treatment duration is 5 years.  Return to clinic based on her decision regarding the clinical trial.     All questions were answered. The patient knows to call the clinic with any problems, questions or concerns.    Rulon Eisenmenger, MD 04/08/16

## 2016-04-08 NOTE — Progress Notes (Signed)
Clinical Social Work [place] Psychosocial Distress Screening [place]  Patient completed distress screening protocol and scored a 4 on the Psychosocial Distress Thermometer which indicates mild distress. Clinical Social Worker met with patient in [facility] to assess for distress and other psychosocial needs. Patient stated she was feeling overwhelmed but felt "better" after meeting with the treatment team and getting more information on her treatment plan. CSW and patient discussed common feeling and emotions when being diagnosed with cancer, and the importance of support during treatment. CSW informed patient of the support team and support services at [facility]. CSW provided contact information and encouraged patient to call with any questions or concerns.  ON[facility]N DISTRESS SCREENING 04/08/2016  Screening Type Initial Screening  Distress experienced in past week (1-10) 4  Practical problem type Work/school;Transportation;Childcare  Information Concerns Type Lack of info about diagnosis     Johnnye Lana, MSW, LCSW, OSW-C Clinical Social Worker [facility] [phone]

## 2016-04-08 NOTE — Progress Notes (Addendum)
REFERRING PROVIDER: Nicholas Lose, MD  PRIMARY PROVIDER:  Pcp Not In System  PRIMARY REASON FOR VISIT:  1. Malignant neoplasm of upper-inner quadrant of right breast in female, estrogen receptor positive ([place])   2. Family history of breast cancer   3. Family history of prostate cancer   4. Family history of brain cancer      HISTORY OF PRESENT ILLNESS:   Anna Reid, a 45 y.o. female, was seen for a [place] cancer genetics consultation at the request of Dr. Lindi Adie due to a personal and family history of cancer.  Anna Reid presents to clinic today to discuss the possibility of a hereditary predisposition to cancer, genetic testing, and to further clarify her future cancer risks, as well as potential cancer risks for family members.   In October 2017, at the age of 13, Anna Reid was diagnosed with DCIS of the right breast. This will be treated with lumpectomy and radiation, unless the genetics come back positive.     CANCER HISTORY:   No history exists.     HORMONAL RISK FACTORS:  Menarche was at age 22-14.  First live birth at age 70.  OCP use for approximately 5 years.  Ovaries intact: yes.  Hysterectomy: no.  Menopausal status: premenopausal.  HRT use: 0 years. Colonoscopy: no; not examined. Mammogram within the last year: yes. Number of breast biopsies: 1. Up to date with pelvic exams:  yes. Any excessive radiation exposure in the past:  no  Past Medical History:  Diagnosis Date  . Abnormal Pap smear    ASC-US  . Family history of brain cancer   . Family history of breast cancer   . Family history of prostate cancer   . Fatigue   . JASNKNLZ(767.3)     Past Surgical History:  Procedure Laterality Date  . STRABISMUS SURGERY    . TONSILLECTOMY      Social History   Social History  . Marital status: Divorced    Spouse name: N/A  . Number of children: 2  . Years of education: N/A   Social History Main Topics  . Smoking status: Former Smoker   Packs/day: 1.00    Types: Cigarettes  . Smokeless tobacco: Never Used  . Alcohol use No  . Drug use: No  . Sexual activity: Yes    Birth control/ protection: IUD   Other Topics Concern  . None   Social History Narrative  . None     FAMILY HISTORY:  We obtained a detailed, 4-generation family history.  Significant diagnoses are listed below: Family History  Problem Relation Age of Onset  . Brain cancer Mother 74    glioblastoma  . Other Mother     oral cancer  . Lung cancer Maternal Grandfather   . Breast cancer Maternal Aunt 74  . Other Maternal Uncle     oral cancer  . Dementia Maternal Grandmother   . Diabetes Paternal Grandmother   . Congestive Heart Failure Paternal Grandmother   . Prostate cancer Paternal Grandfather   . Other Maternal Uncle     oral cancer    The patient has two boys who are healthy and cancer free  Her oldest son has X-linked Ocular Albinism and her second son has spina bifida.  She has one sister who is healthy and cancer free.  Her mother was diagnosed with tongue cancer at 35 and a glioblastoma at 65.  She had three brothers and a sister.  Two brothers had oral  cancers, and one is cancer free.  Her sister was diagnosed with breast cancer at 47.  The patient's maternal grandfather was diagnosed with lung cancer in his 35's.  The patient's father had one brother who is cancer free.  His father had prostate cancer and his mother died in her 75's.  Patient's maternal ancestors are of [place] descent, and paternal ancestors are of [place] descent. There is no reported Ashkenazi Jewish ancestry. There is no known consanguinity.  GENETIC COUNSELING ASSESSMENT: Anna Reid is a 45 y.o. female with a personal and family history of cancer which is somewhat suggestive of a hereditary cancer syndrome and predisposition to cancer. We, therefore, discussed and recommended the following at today's visit.   DISCUSSION: We discussed that about 5-10% of breast cancer  is hereditary with most cases due to BRCA mutations.  Other genes can be associated with hereditary breast cancers including PALB2, ATM and CHEK2.  We reviewed the characteristics, features and inheritance patterns of hereditary cancer syndromes. We also discussed genetic testing, including the appropriate family members to test, the process of testing, insurance coverage and turn-around-time for results. We discussed the implications of a negative, positive and/or variant of uncertain significant result. We recommended Anna Reid pursue genetic testing for the Breast/Ovarian cancer gene panel. The Breast/Ovarian gene panel offered by [facility] includes sequencing and rearrangement analysis for the following 20 genes:  ATM, BARD1, BRCA1, BRCA2, BRIP1, CDH1, CHEK2, EPCAM, FANCC, MLH1, MSH2, MSH6, NBN, PALB2, PMS2, PTEN, RAD51C, RAD51D, TP53, and XRCC2.     Based on Anna Reid's personal and family history of cancer, she meets medical criteria for genetic testing. Despite that she meets criteria, she may still have an out of pocket cost. We discussed that if her out of pocket cost for testing is over $100, the laboratory will call and confirm whether she wants to proceed with testing.  If the out of pocket cost of testing is less than $100 she will be billed by the genetic testing laboratory.   We did not discuss the family history of ocular albinism and spina bifida in her sons.  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  PLAN: After considering the risks, benefits, and limitations, Anna Reid  provided informed consent to pursue genetic testing and the blood sample was sent to [facility] for analysis of the Breast/Ovarian cancer panel. Results have been rushed, which should hopefully be available within approximately 2 weeks' time, at which point they will be disclosed by telephone to  Anna Reid, as will any additional recommendations warranted by these results. Anna Reid will receive a summary of her genetic counseling visit and a copy of her results once available. This information will also be available in Epic. We encouraged Anna Reid to remain in contact with cancer genetics annually so that we can continuously update the family history and inform her of any changes in cancer genetics and testing that may be of benefit for her family. Anna Reid questions were answered to her satisfaction today. Our contact information was provided should additional questions or concerns arise.  Lastly, we encouraged Anna Reid to remain in contact with cancer genetics annually so that we can continuously update the family history and inform her of any changes in cancer genetics and testing that may be of benefit for this family.   Ms.  Reid  questions were answered to her satisfaction today. Our contact information was provided should additional questions or concerns arise. Thank you for the referral and allowing [place] to share in the care of your patient.   Natlie Asfour P. Florene Glen, [place], [facility] Certified Genetic Counselor [email or web address] phone: [phone]  The patient was seen for a total of 45 minutes in face-to-face genetic counseling.  This patient was discussed with Drs. Magrinat, Lindi Adie and/or Burr Medico who agrees with the above.    _______________________________________________________________________ For Office Staff:  Number of people involved in session: 2 Was an Intern/ student involved with case: no

## 2016-04-08 NOTE — Assessment & Plan Note (Signed)
03/31/2016 Right breast biopsy: DCIS with calcifications ER 100%, PR 95%, low intermediate grade, Tis N0 stage 0; screening mammogram revealed right breast calcifications 4 mm in size  Pathology review: I discussed with the patient the difference between DCIS and invasive breast cancer. It is considered a precancerous lesion. DCIS is classified as a 0. It is generally detected through mammograms as calcifications. We discussed the significance of grades and its impact on prognosis. We also discussed the importance of ER and PR receptors and their implications to adjuvant treatment options. Prognosis of DCIS dependence on grade, comedo necrosis. It is anticipated that if not treated, 10-30% of DCIS can develop into invasive breast cancer.  Recommendation: 1. Genetic counseling 2. if no genetic mutations were found, she could participate in COMET trial AFT 25 COMET Phase 3 clinical trial for low risk DCIS grade 1/2 PR positive, age greater than 64 randomized to surgery +/- radiation, +/- endocrine therapy versus active surveillance with +/- endocrine therapy surveillance with mammograms every 6 months for 5 years;patient's have option to decline elevated arm and still be followed on study   Standard of care would be 1. Breast conserving surgery 2. Followed by adjuvant radiation therapy 3. Followed by antiestrogen therapy with tamoxifen 5 years  Tamoxifen counseling: We discussed the risks and benefits of tamoxifen. These include but not limited to insomnia, hot flashes, mood changes, vaginal dryness, and weight gain. Although rare, serious side effects including endometrial cancer, risk of blood clots were also discussed. We strongly believe that the benefits far outweigh the risks. Patient understands these risks and consented to starting treatment. Planned treatment duration is 5 years.  Return to clinic based on her decision regarding the clinical trial.

## 2016-04-09 ENCOUNTER — Encounter: Payer: Self-pay | Admitting: Genetic Counselor

## 2016-04-13 ENCOUNTER — Telehealth: Payer: Self-pay | Admitting: *Deleted

## 2016-04-13 NOTE — Telephone Encounter (Signed)
  Oncology Nurse Navigator Documentation  Navigator Location: [facility] (04/13/16 1400) Navigator Encounter Type: Telephone (04/13/16 1400) Telephone: Outgoing Call;Clinic/[facility] Follow-up (04/13/16 1400)                                        Time Spent with Patient: 15 (04/13/16 1400)

## 2016-04-20 ENCOUNTER — Ambulatory Visit: Payer: Self-pay | Admitting: Genetic Counselor

## 2016-04-20 ENCOUNTER — Telehealth: Payer: Self-pay | Admitting: Genetic Counselor

## 2016-04-20 ENCOUNTER — Encounter: Payer: Self-pay | Admitting: Genetic Counselor

## 2016-04-20 DIAGNOSIS — C50211 Malignant neoplasm of upper-inner quadrant of right female breast: Secondary | ICD-10-CM

## 2016-04-20 DIAGNOSIS — Z803 Family history of malignant neoplasm of breast: Secondary | ICD-10-CM

## 2016-04-20 DIAGNOSIS — Z170 Estrogen receptor positive status [ER+]: Secondary | ICD-10-CM

## 2016-04-20 DIAGNOSIS — Z808 Family history of malignant neoplasm of other organs or systems: Secondary | ICD-10-CM

## 2016-04-20 DIAGNOSIS — Z1379 Encounter for other screening for genetic and chromosomal anomalies: Secondary | ICD-10-CM | POA: Insufficient documentation

## 2016-04-20 DIAGNOSIS — Z8042 Family history of malignant neoplasm of prostate: Secondary | ICD-10-CM

## 2016-04-20 NOTE — Telephone Encounter (Signed)
Revealed negative genetic testing.  Discussed that patient had a VUS in [place].  Explained that this is a change that is not determined to be a positive test, or a negative test.  We do not feel that this change has clinical significance.  I will let her care team know.  Patient voiced understanding.

## 2016-04-20 NOTE — Progress Notes (Signed)
HPI: Ms. Espy was previously seen in the [place] clinic due to a personal history of cancer and concerns regarding a hereditary predisposition to cancer. Please refer to our prior cancer genetics clinic note for more information regarding Ms. Turano's medical, social and family histories, and our assessment and recommendations, at the time. Ms. Furnari recent genetic test results were disclosed to her, as were recommendations warranted by these results. These results and recommendations are discussed in more detail below.  FAMILY HISTORY:  We obtained a detailed, 4-generation family history.  Significant diagnoses are listed below: Family History  Problem Relation Age of Onset  . Brain cancer Mother 36    glioblastoma  . Other Mother     oral cancer  . Lung cancer Maternal Grandfather   . Breast cancer Maternal Aunt 74  . Other Maternal Uncle     oral cancer  . Dementia Maternal Grandmother   . Diabetes Paternal Grandmother   . Congestive Heart Failure Paternal Grandmother   . Prostate cancer Paternal Grandfather   . Other Maternal Uncle     oral cancer  . Albinism Son     ocular albinism - x linked  . Spina bifida Son    The patient has two boys who are healthy and cancer free  Her oldest son has X-linked Ocular Albinism and her second son has spina bifida.  She has one sister who is healthy and cancer free.  Her mother was diagnosed with tongue cancer at 70 and a glioblastoma at 18.  She had three brothers and a sister.  Two brothers had oral cancers, and one is cancer free.  Her sister was diagnosed with breast cancer at 75.  The patient's maternal grandfather was diagnosed with lung cancer in his 65's.  The patient's father had one brother who is cancer free.  His father had prostate cancer and his mother died in her 11's.  Patient's maternal ancestors are of [place] descent, and paternal ancestors are of [place] descent. There is no reported Ashkenazi Jewish  ancestry. There is no known consanguinity.  GENETIC TEST RESULTS: At the time of Ms. Ruperto's visit, we recommended she pursue genetic testing of the Breast/Ovarian cancer gene panel. The Breast/Ovarian gene panel offered by [facility] includes sequencing and rearrangement analysis for the following 20 genes:  ATM, BARD1, BRCA1, BRCA2, BRIP1, CDH1, CHEK2, EPCAM, FANCC, MLH1, MSH2, MSH6, NBN, PALB2, PMS2, PTEN, RAD51C, RAD51D, TP53, and XRCC2.   The report date is April 17, 2016.  Genetic testing was normal, and did not reveal a deleterious mutation in these genes. The test report has been scanned into EPIC and is located under the Molecular Pathology section of the Results Review tab.   We discussed with Ms. Marlar that since the current genetic testing is not perfect, it is possible there may be a gene mutation in one of these genes that current testing cannot detect, but that chance is small. We also discussed, that it is possible that another gene that has not yet been discovered, or that we have not yet tested, is responsible for the cancer diagnoses in the family, and it is, therefore, important to remain in touch with cancer genetics in the future so that we can continue to offer Ms. Keo the most up to date genetic testing.   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 At this time, it is unknown if this variant is associated with increased cancer risk  or if this is a normal finding, but most variants such as this get reclassified to being inconsequential. It should not be used to make medical management decisions. With time, we suspect the lab will determine the significance of this variant, if any. If we do learn more about it, we will try to contact Ms. Yon to discuss it further. However, it is important to stay in touch with [place] periodically and keep the address and phone number up to date.   CANCER SCREENING RECOMMENDATIONS: This result is  reassuring and indicates that Ms. Sieben likely does not have an increased risk for a future cancer due to a mutation in one of these genes. This normal test also suggests that Ms. Barra's cancer was most likely not due to an inherited predisposition associated with one of these genes.  Most cancers happen by chance and this negative test suggests that her cancer falls into this category.  We, therefore, recommended she continue to follow the cancer management and screening guidelines provided by her oncology and primary healthcare provider.   RECOMMENDATIONS FOR FAMILY MEMBERS: Women in this family might be at some increased risk of developing cancer, over the general population risk, simply due to the family history of cancer. We recommended women in this family have a yearly mammogram beginning at age 9, or 69 years younger than the earliest onset of cancer, an annual clinical breast exam, and perform monthly breast self-exams. Women in this family should also have a gynecological exam as recommended by their primary provider. All family members should have a colonoscopy by age 64.  FOLLOW-UP: Lastly, we discussed with Ms. Sneath that cancer genetics is a rapidly advancing field and it is possible that new genetic tests will be appropriate for her and/or her family members in the future. We encouraged her to remain in contact with cancer genetics on an annual basis so we can update her personal and family histories and let her know of advances in cancer genetics that may benefit this family.   Our contact number was provided. Ms. Pillard questions were answered to her satisfaction, and she knows she is welcome to call us at anytime with additional questions or concerns.   Roma Kayser, MS, [facility] Certified Genetic Counselor [email or web address]

## 2016-04-22 ENCOUNTER — Other Ambulatory Visit: Payer: Self-pay | Admitting: Surgery

## 2016-04-22 DIAGNOSIS — D0591 Unspecified type of carcinoma in situ of right breast: Secondary | ICD-10-CM

## 2016-04-22 DIAGNOSIS — C50211 Malignant neoplasm of upper-inner quadrant of right female breast: Secondary | ICD-10-CM

## 2016-04-27 ENCOUNTER — Other Ambulatory Visit: Payer: Self-pay | Admitting: Surgery

## 2016-04-27 ENCOUNTER — Telehealth: Payer: Self-pay | Admitting: *Deleted

## 2016-04-27 DIAGNOSIS — D0591 Unspecified type of carcinoma in situ of right breast: Secondary | ICD-10-CM

## 2016-04-27 NOTE — Telephone Encounter (Signed)
  Oncology Nurse Navigator Documentation  Navigator Location: [facility] (04/27/16 1300)   )Navigator Encounter Type: Telephone (04/27/16 1300) Telephone: Incoming Call (04/27/16 1300)                                                  Time Spent with Patient: 15 (04/27/16 1300)

## 2016-05-01 ENCOUNTER — Encounter (HOSPITAL_BASED_OUTPATIENT_CLINIC_OR_DEPARTMENT_OTHER): Payer: Self-pay | Admitting: *Deleted

## 2016-05-06 HISTORY — PX: BREAST LUMPECTOMY: SHX2

## 2016-05-07 ENCOUNTER — Ambulatory Visit
Admission: RE | Admit: 2016-05-07 | Discharge: 2016-05-07 | Disposition: A | Discharge status: Home / Self Care | Payer: BLUE CROSS/BLUE SHIELD | Source: Ambulatory Visit | Attending: Surgery | Admitting: Surgery

## 2016-05-07 DIAGNOSIS — D0591 Unspecified type of carcinoma in situ of right breast: Secondary | ICD-10-CM

## 2016-05-07 NOTE — Pre-Procedure Instructions (Signed)
Pt in to pick up Boost Breeze, instructions reviewed. 

## 2016-05-10 NOTE — H&P (Signed)
Anna Reid  Location: [facility] Patient #: [id number] DOB: [birth year] Undefined / Language: Anna Reid / Race: White Female  History of Present Illness   The patient is a 45 year old female who presents with a complaint of breast cancer.   The PCP is Dr. Burnard Leigh  The patient was referred by Dr. Lennie Muckle  She is at the [facility] - Drs. Gudena/Moody  She is with her sister, Anna Reid.  She has gotten annual mammograms. She has no prior history of breast biopsy or problems. She had an IUD removed about one year ago. She has had spotty periods, but she said not "real" periods. Her aunt has Her2Nue positive breast cancer, is being treated at [facility] in [place], and clearly has a much more advanced breast cancer than Anna Reid. According to Anna Reid, her aunt has had trouble with radiation tx. Anna Reid underwent a mammogram on 9/22/107 which showed a 4 mm group of punctate ca++ in the right breast at about 1 o'clock. 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  I discussed the options for breast cancer treatment with the patient. She is at the [facility], which includes medical oncology and radiation oncology. I discussed the surgical options of lumpectomy vs. mastectomy. If mastectomy, there is the possibility of reconstruction. I discussed the options of lymph node biopsy. The treatment plan depends on the pathologic staging of the tumor and the patient's personal wishes. The risks of surgery include, but are not limited to, bleeding, infection, the need for further surgery, and nerve injury. The patient has been given literature on the treatment of breast cancer.  Plan: 1) Will do genetics first, 2) Candidate for the COMET study (info given to patient) vs lumpectomy alone - though I don't think that the COMET trial appeals to her much. For now, if her genetics are negative,  will proceed with lumpectomy (seed loc) only. I will talk to her after the genetics.  Past Medical History: 1. She sees Dr. 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 She is with her sister, Anna Reid. has 2 children: 64 yo son and 42 yo son   Other Problems Anna Pummel, RN; 04/08/2016 7:51 AM) Breast Cancer General anesthesia - complications Lump In Breast  Past Surgical History Anna Pummel, RN; 04/08/2016 7:51 AM) Breast Biopsy Right. Oral Surgery Tonsillectomy  Diagnostic Studies History Anna Pummel, RN; 04/08/2016 7:51 AM) Colonoscopy never Mammogram within last year Pap Smear 1-5 years ago  Medication History Anna Pummel, RN; 04/08/2016 7:52 AM) Medications Reconciled  Social History Anna Pummel, RN; 04/08/2016 7:51 AM) Alcohol use Remotely quit alcohol use. Caffeine use Coffee, Tea. No drug use Tobacco use Former smoker.  Family History Anna Pummel, RN; 04/08/2016 7:51 AM) Alcohol Abuse Family Members In General. Arthritis Family Members In General. Breast Cancer Family Members In [place] Mother. Cervical Cancer Family Members In General. Depression Mother. Heart Disease Family Members In General. Hypertension Father. Melanoma Father, Sister. Respiratory Condition Family Members In General. Seizure disorder Mother.  Pregnancy / Birth History Anna Pummel, RN; 04/08/2016 7:51 AM) Age at menarche 38 years. Contraceptive History Intrauterine device. Gravida 2 Irregular periods Length (months) of breastfeeding 7-12 Maternal age 8-30 Para 2    Review of Systems Anna Pummel RN; 04/08/2016 7:51 AM) General Not Present- Appetite Loss, Chills, Fatigue, Fever, Night Sweats, Weight Gain and Weight Loss. Skin Not Present-  Change in Wart/Mole, Dryness, Hives, Jaundice, New Lesions, Non-Healing Wounds, Rash and Ulcer. HEENT Present- Wears  glasses/contact lenses. Not Present- Earache, Hearing Loss, Hoarseness, Nose Bleed, Oral Ulcers, Ringing in the Ears, Seasonal Allergies, Sinus Pain, Sore Throat, Visual Disturbances and Yellow Eyes. Respiratory Not Present- Bloody sputum, Chronic Cough, Difficulty Breathing, Snoring and Wheezing. Breast Present- Breast Pain. Not Present- Breast Mass, Nipple Discharge and Skin Changes. Cardiovascular Not Present- Chest Pain, Difficulty Breathing Lying Down, Leg Cramps, Palpitations, Rapid Heart Rate, Shortness of Breath and Swelling of Extremities. Gastrointestinal Present- Change in Bowel Habits. Not Present- Abdominal Pain, Bloating, Bloody Stool, Chronic diarrhea, Constipation, Difficulty Swallowing, Excessive gas, Gets full quickly at meals, Hemorrhoids, Indigestion, Nausea, Rectal Pain and Vomiting. Female Genitourinary Not Present- Frequency, Nocturia, Painful Urination, Pelvic Pain and Urgency. Musculoskeletal Not Present- Back Pain, Joint Pain, Joint Stiffness, Muscle Pain, Muscle Weakness and Swelling of Extremities. Neurological Present- Tingling. Not Present- Decreased Memory, Fainting, Headaches, Numbness, Seizures, Tremor, Trouble walking and Weakness. Psychiatric Not Present- Anxiety, Bipolar, Change in Sleep Pattern, Depression, Fearful and Frequent crying. Endocrine Not Present- Cold Intolerance, Excessive Hunger, Hair Changes, Heat Intolerance, Hot flashes and New Diabetes. Hematology Present- Easy Bruising. Not Present- Blood Thinners, Excessive bleeding, Gland problems, HIV and Persistent Infections.   Physical Exam  General: WN WFalert and generally healthy appearing. She has a flat affect Skin: Inspection and palpation of the skin unremarkable.  Eyes: Conjunctivae white, pupils equal. Face, ears, nose, mouth, and throat: Face - normal. Normal ears and nose. Lips and teeth normal.  Neck: Supple. No mass. Trachea midline. No thyroid mass.  Lymph Nodes: No  supraclavicular or cervical adenopathy. No axillary adenopathy.  Breasts: Right - Bruise at 12 o'clock Left - No mass or nipple discharge.  Lungs: Normal respiratory effort. Clear to auscultation and symmetric breath sounds. Cardiovascular: Regular rate and rythm. Normal auscultation of the heart. No murmur or rub. Normal carotid pulse.  Abdomen: Soft. No mass. Liver and spleen not palpable. No tenderness. No hernia. Normal bowel sounds. No abdominal scars. Tattoo left abdomen.  Musculoskeletal/extremities: Normal gait. Good strength and ROM in upper and lower extremities.   Neurologic: Grossly intact to motor and sensory function.  No obvious deficit in the cranial nerves.  Psychiatric: Has normal mood and affect. Judgement and insight appear normal.  Assessment & Plan  1.  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   Oncology - Gudena/Moody  Plan:   1) Wil do genetics first,   2) Candidate for the COMET study (info given to patient) vs lumpectomy alone - though I don't think that the COMET trial appeals to her much. For now, if her genetics are negative, will proceed with lumpectomy (seed loc) only. I will talk to her after the genetics.  Addendum Note(Jude Naclerio H. Lucia Gaskins MD; 04/22/2016 7:46 PM) Her genetics were negative. I talked to her for 15 minutes on the phone. She is not interested in the COMET study. She forgot about needing a seed to loc the lesion. Will schedule her surgery. I told her I would be out of town the week of [place].   Alphonsa Overall, MD, [facility] Pager: [phone] Office phone:  [phone]

## 2016-05-11 ENCOUNTER — Ambulatory Visit (HOSPITAL_BASED_OUTPATIENT_CLINIC_OR_DEPARTMENT_OTHER): Payer: BLUE CROSS/BLUE SHIELD | Admitting: Anesthesiology

## 2016-05-11 ENCOUNTER — Ambulatory Visit
Admission: RE | Admit: 2016-05-11 | Discharge: 2016-05-11 | Disposition: A | Discharge status: Home / Self Care | Payer: BLUE CROSS/BLUE SHIELD | Source: Ambulatory Visit | Attending: Surgery | Admitting: Surgery

## 2016-05-11 ENCOUNTER — Ambulatory Visit (HOSPITAL_BASED_OUTPATIENT_CLINIC_OR_DEPARTMENT_OTHER)
Admission: RE | Admit: 2016-05-11 | Discharge: 2016-05-11 | Disposition: A | Discharge status: Home / Self Care | Payer: BLUE CROSS/BLUE SHIELD | Source: Ambulatory Visit | Attending: Surgery | Admitting: Surgery

## 2016-05-11 ENCOUNTER — Encounter (HOSPITAL_BASED_OUTPATIENT_CLINIC_OR_DEPARTMENT_OTHER): Payer: Self-pay | Admitting: *Deleted

## 2016-05-11 ENCOUNTER — Encounter (HOSPITAL_BASED_OUTPATIENT_CLINIC_OR_DEPARTMENT_OTHER)
Admission: RE | Disposition: A | Discharge status: Home / Self Care | Payer: Self-pay | Source: Ambulatory Visit | Attending: Surgery

## 2016-05-11 DIAGNOSIS — Z87891 Personal history of nicotine dependence: Secondary | ICD-10-CM | POA: Insufficient documentation

## 2016-05-11 DIAGNOSIS — Z803 Family history of malignant neoplasm of breast: Secondary | ICD-10-CM | POA: Insufficient documentation

## 2016-05-11 DIAGNOSIS — D0511 Intraductal carcinoma in situ of right breast: Secondary | ICD-10-CM | POA: Insufficient documentation

## 2016-05-11 DIAGNOSIS — D0591 Unspecified type of carcinoma in situ of right breast: Secondary | ICD-10-CM

## 2016-05-11 HISTORY — PX: BREAST LUMPECTOMY WITH RADIOACTIVE SEED LOCALIZATION: SHX6424

## 2016-05-11 SURGERY — BREAST LUMPECTOMY WITH RADIOACTIVE SEED LOCALIZATION
Anesthesia: General | Site: Breast | Laterality: Right

## 2016-05-11 MED ORDER — LIDOCAINE HCL (CARDIAC) 20 MG/ML IV SOLN
INTRAVENOUS | Status: DC | PRN
  Administered 2016-05-11: 30 mg via INTRAVENOUS

## 2016-05-11 MED ORDER — SCOPOLAMINE 1 MG/3DAYS TD PT72
MEDICATED_PATCH | TRANSDERMAL | Status: AC
  Filled 2016-05-11: qty 1

## 2016-05-11 MED ORDER — PROPOFOL 500 MG/50ML IV EMUL
INTRAVENOUS | Status: AC
  Filled 2016-05-11: qty 50

## 2016-05-11 MED ORDER — ONDANSETRON HCL 4 MG/2ML IJ SOLN
INTRAMUSCULAR | Status: DC | PRN
Start: 2016-05-11 — End: 2016-05-11
  Administered 2016-05-11: 4 mg via INTRAVENOUS

## 2016-05-11 MED ORDER — LACTATED RINGERS IV SOLN
INTRAVENOUS | Status: DC
  Administered 2016-05-11 (×2): via INTRAVENOUS

## 2016-05-11 MED ORDER — GLYCOPYRROLATE 0.2 MG/ML IJ SOLN: 0.2000 mg | Freq: Once | INTRAMUSCULAR | Status: DC | PRN

## 2016-05-11 MED ORDER — GLYCOPYRROLATE 0.2 MG/ML IJ SOLN
INTRAMUSCULAR | Status: DC | PRN
  Administered 2016-05-11 (×2): 0.2 mg via INTRAVENOUS

## 2016-05-11 MED ORDER — ACETAMINOPHEN 500 MG PO TABS
1000.0000 mg | ORAL_TABLET | ORAL | Status: AC
  Administered 2016-05-11: 1000 mg via ORAL

## 2016-05-11 MED ORDER — SCOPOLAMINE 1 MG/3DAYS TD PT72
1.0000 | MEDICATED_PATCH | Freq: Once | TRANSDERMAL | Status: DC | PRN
  Administered 2016-05-11: 1.5 mg via TRANSDERMAL

## 2016-05-11 MED ORDER — CEFAZOLIN SODIUM-DEXTROSE 2-4 GM/100ML-% IV SOLN
2.0000 g | INTRAVENOUS | Status: AC
  Administered 2016-05-11: 2 g via INTRAVENOUS

## 2016-05-11 MED ORDER — ONDANSETRON HCL 4 MG/2ML IJ SOLN
INTRAMUSCULAR | Status: AC
  Filled 2016-05-11: qty 2

## 2016-05-11 MED ORDER — HYDROMORPHONE HCL 1 MG/ML IJ SOLN: 0.2500 mg | INTRAMUSCULAR | Status: DC | PRN

## 2016-05-11 MED ORDER — 0.9 % SODIUM CHLORIDE (POUR BTL) OPTIME
TOPICAL | Status: DC | PRN
  Administered 2016-05-11: 500 mL

## 2016-05-11 MED ORDER — ESMOLOL HCL 100 MG/10ML IV SOLN
INTRAVENOUS | Status: AC
  Filled 2016-05-11: qty 10

## 2016-05-11 MED ORDER — MIDAZOLAM HCL 2 MG/2ML IJ SOLN
INTRAMUSCULAR | Status: AC
  Filled 2016-05-11: qty 2

## 2016-05-11 MED ORDER — SCOPOLAMINE 1 MG/3DAYS TD PT72: 1.0000 | MEDICATED_PATCH | TRANSDERMAL | Status: DC

## 2016-05-11 MED ORDER — GLYCOPYRROLATE 0.2 MG/ML IV SOSY
PREFILLED_SYRINGE | INTRAVENOUS | Status: AC
  Filled 2016-05-11: qty 3

## 2016-05-11 MED ORDER — ESMOLOL HCL 100 MG/10ML IV SOLN
INTRAVENOUS | Status: DC | PRN
  Administered 2016-05-11 (×2): 10 mg via INTRAVENOUS

## 2016-05-11 MED ORDER — FENTANYL CITRATE (PF) 100 MCG/2ML IJ SOLN: 50.0000 ug | INTRAMUSCULAR | Status: DC | PRN

## 2016-05-11 MED ORDER — MIDAZOLAM HCL 5 MG/5ML IJ SOLN
INTRAMUSCULAR | Status: DC | PRN
  Administered 2016-05-11: 2 mg via INTRAVENOUS

## 2016-05-11 MED ORDER — PROMETHAZINE HCL 25 MG/ML IJ SOLN: 6.2500 mg | INTRAMUSCULAR | Status: DC | PRN

## 2016-05-11 MED ORDER — GABAPENTIN 300 MG PO CAPS
300.0000 mg | ORAL_CAPSULE | ORAL | Status: AC
  Administered 2016-05-11: 300 mg via ORAL

## 2016-05-11 MED ORDER — CEFAZOLIN SODIUM-DEXTROSE 2-4 GM/100ML-% IV SOLN
INTRAVENOUS | Status: AC
  Filled 2016-05-11: qty 100

## 2016-05-11 MED ORDER — GABAPENTIN 300 MG PO CAPS
ORAL_CAPSULE | ORAL | Status: AC
  Filled 2016-05-11: qty 1

## 2016-05-11 MED ORDER — ARTIFICIAL TEARS OP OINT
TOPICAL_OINTMENT | OPHTHALMIC | Status: AC
  Filled 2016-05-11: qty 3.5

## 2016-05-11 MED ORDER — DEXAMETHASONE SODIUM PHOSPHATE 10 MG/ML IJ SOLN
INTRAMUSCULAR | Status: AC
  Filled 2016-05-11: qty 1

## 2016-05-11 MED ORDER — BUPIVACAINE HCL (PF) 0.25 % IJ SOLN
INTRAMUSCULAR | Status: AC
  Filled 2016-05-11: qty 30

## 2016-05-11 MED ORDER — DEXAMETHASONE SODIUM PHOSPHATE 4 MG/ML IJ SOLN
INTRAMUSCULAR | Status: DC | PRN
  Administered 2016-05-11: 10 mg via INTRAVENOUS

## 2016-05-11 MED ORDER — BUPIVACAINE-EPINEPHRINE 0.25% -1:200000 IJ SOLN
INTRAMUSCULAR | Status: DC | PRN
  Administered 2016-05-11: 30 mL

## 2016-05-11 MED ORDER — LIDOCAINE 2% (20 MG/ML) 5 ML SYRINGE
INTRAMUSCULAR | Status: AC
  Filled 2016-05-11: qty 5

## 2016-05-11 MED ORDER — EPHEDRINE SULFATE 50 MG/ML IJ SOLN
INTRAMUSCULAR | Status: DC | PRN
  Administered 2016-05-11 (×2): 10 mg via INTRAVENOUS

## 2016-05-11 MED ORDER — FENTANYL CITRATE (PF) 100 MCG/2ML IJ SOLN
INTRAMUSCULAR | Status: DC | PRN
  Administered 2016-05-11: 100 ug via INTRAVENOUS

## 2016-05-11 MED ORDER — FENTANYL CITRATE (PF) 100 MCG/2ML IJ SOLN
INTRAMUSCULAR | Status: AC
  Filled 2016-05-11: qty 2

## 2016-05-11 MED ORDER — PROPOFOL 10 MG/ML IV BOLUS
INTRAVENOUS | Status: DC | PRN
  Administered 2016-05-11: 150 mg via INTRAVENOUS

## 2016-05-11 MED ORDER — MIDAZOLAM HCL 2 MG/2ML IJ SOLN: 1.0000 mg | INTRAMUSCULAR | Status: DC | PRN

## 2016-05-11 MED ORDER — ACETAMINOPHEN 500 MG PO TABS
ORAL_TABLET | ORAL | Status: AC
  Filled 2016-05-11: qty 2

## 2016-05-11 MED ORDER — HYDROCODONE-ACETAMINOPHEN 5-325 MG PO TABS: 1.0000 | ORAL_TABLET | Freq: Four times a day (QID) | ORAL | 0 refills | Status: DC | PRN

## 2016-05-11 MED ORDER — CHLORHEXIDINE GLUCONATE CLOTH 2 % EX PADS: 6.0000 | MEDICATED_PAD | Freq: Once | CUTANEOUS | Status: DC

## 2016-05-11 MED ORDER — EPHEDRINE 5 MG/ML INJ
INTRAVENOUS | Status: AC
  Filled 2016-05-11: qty 10

## 2016-05-11 SURGICAL SUPPLY — 49 items
ADH SKN CLS APL DERMABOND .7 (GAUZE/BANDAGES/DRESSINGS) ×1
APL SKNCLS STERI-STRIP NONHPOA (GAUZE/BANDAGES/DRESSINGS)
BENZOIN TINCTURE PRP APPL 2/3 (GAUZE/BANDAGES/DRESSINGS) IMPLANT
BINDER BREAST LRG (GAUZE/BANDAGES/DRESSINGS) ×2 IMPLANT
BINDER BREAST XLRG (GAUZE/BANDAGES/DRESSINGS) IMPLANT
BLADE SURG 15 STRL LF DISP TIS (BLADE) ×1 IMPLANT
BLADE SURG 15 STRL SS (BLADE) ×3
CANISTER SUC SOCK COL 7IN (MISCELLANEOUS) IMPLANT
CANISTER SUCT 1200ML W/VALVE (MISCELLANEOUS) ×3 IMPLANT
CHLORAPREP W/TINT 26ML (MISCELLANEOUS) ×3 IMPLANT
CLIP TI WIDE RED SMALL 6 (CLIP) ×2 IMPLANT
CLOSURE WOUND 1/4X4 (GAUZE/BANDAGES/DRESSINGS)
COVER BACK TABLE 60X90IN (DRAPES) ×3 IMPLANT
COVER MAYO STAND STRL (DRAPES) ×3 IMPLANT
COVER PROBE W GEL 5X96 (DRAPES) ×3 IMPLANT
DERMABOND ADVANCED (GAUZE/BANDAGES/DRESSINGS) ×2
DERMABOND ADVANCED .7 DNX12 (GAUZE/BANDAGES/DRESSINGS) ×1 IMPLANT
DEVICE DUBIN W/COMP PLATE 8390 (MISCELLANEOUS) ×3 IMPLANT
DRAPE LAPAROTOMY 100X72 PEDS (DRAPES) ×3 IMPLANT
DRAPE UTILITY XL STRL (DRAPES) ×3 IMPLANT
DRSG PAD ABDOMINAL 8X10 ST (GAUZE/BANDAGES/DRESSINGS) IMPLANT
ELECT COATED BLADE 2.86 ST (ELECTRODE) ×3 IMPLANT
ELECT REM PT RETURN 9FT ADLT (ELECTROSURGICAL) ×3
ELECTRODE REM PT RTRN 9FT ADLT (ELECTROSURGICAL) ×1 IMPLANT
GLOVE BIOGEL PI IND STRL 7.0 (GLOVE) IMPLANT
GLOVE BIOGEL PI INDICATOR 7.0 (GLOVE) ×4
GLOVE ECLIPSE 6.5 STRL STRAW (GLOVE) ×2 IMPLANT
GLOVE SURG SIGNA 7.5 PF LTX (GLOVE) ×5 IMPLANT
GOWN STRL REUS W/ TWL LRG LVL3 (GOWN DISPOSABLE) ×1 IMPLANT
GOWN STRL REUS W/ TWL XL LVL3 (GOWN DISPOSABLE) ×1 IMPLANT
GOWN STRL REUS W/TWL LRG LVL3 (GOWN DISPOSABLE) ×3
GOWN STRL REUS W/TWL XL LVL3 (GOWN DISPOSABLE) ×3
KIT MARKER MARGIN INK (KITS) ×3 IMPLANT
NDL HYPO 25X1 1.5 SAFETY (NEEDLE) ×1 IMPLANT
NEEDLE HYPO 25X1 1.5 SAFETY (NEEDLE) ×3 IMPLANT
NS IRRIG 1000ML POUR BTL (IV SOLUTION) ×2 IMPLANT
PACK BASIN DAY SURGERY FS (CUSTOM PROCEDURE TRAY) ×3 IMPLANT
PENCIL BUTTON HOLSTER BLD 10FT (ELECTRODE) ×3 IMPLANT
SLEEVE SCD COMPRESS KNEE MED (MISCELLANEOUS) ×3 IMPLANT
SPONGE GAUZE 4X4 12PLY STER LF (GAUZE/BANDAGES/DRESSINGS) ×2 IMPLANT
SPONGE LAP 18X18 X RAY DECT (DISPOSABLE) ×3 IMPLANT
STRIP CLOSURE SKIN 1/4X4 (GAUZE/BANDAGES/DRESSINGS) IMPLANT
SUT MNCRL AB 4-0 PS2 18 (SUTURE) ×3 IMPLANT
SUT VICRYL 3-0 CR8 SH (SUTURE) ×3 IMPLANT
SYR CONTROL 10ML LL (SYRINGE) ×3 IMPLANT
TOWEL OR 17X24 6PK STRL BLUE (TOWEL DISPOSABLE) ×3 IMPLANT
TUBE CONNECTING 20'X1/4 (TUBING) ×1
TUBE CONNECTING 20X1/4 (TUBING) ×2 IMPLANT
YANKAUER SUCT BULB TIP NO VENT (SUCTIONS) ×3 IMPLANT

## 2016-05-11 NOTE — Interval H&P Note (Signed)
History and Physical Interval Note:  05/11/2016 7:29 AM  Anna Reid  has presented today for surgery, with the diagnosis of RIGHT BREAST CANCER  The various methods of treatment have been discussed with the patient and family.   Seed in location.  Sister with patient.  After consideration of risks, benefits and other options for treatment, the patient has consented to  Procedure(s): RIGHT BREAST LUMPECTOMY WITH RADIOACTIVE SEED LOCALIZATION (Right) as a surgical intervention .  The patient's history has been reviewed, patient examined, no change in status, stable for surgery.  I have reviewed the patient's chart and labs.  Questions were answered to the patient's satisfaction.     Coriana Angello H

## 2016-05-11 NOTE — Transfer of Care (Signed)
Immediate Anesthesia Transfer of Care Note  Patient: Anna Reid  Procedure(s) Performed: Procedure(s): RIGHT BREAST LUMPECTOMY WITH RADIOACTIVE SEED LOCALIZATION (Right)  Patient Location: PACU  Anesthesia Type:General  Level of Consciousness: sedated  Airway & Oxygen Therapy: Patient Spontanous Breathing and Patient connected to face mask oxygen  Post-op Assessment: Report given to RN and Post -op Vital signs reviewed and stable  Post vital signs: Reviewed and stable  Last Vitals:  Vitals:   05/11/16 0626  BP: 114/68  Pulse: (!) 58  Resp: 18  Temp: 36.8 C    Last Pain:  Vitals:   05/11/16 0626  TempSrc: Oral         Complications: No apparent anesthesia complications

## 2016-05-11 NOTE — Discharge Instructions (Signed)
[facility] - DISCHARGE INSTRUCTIONS TO PATIENT  Activity:  Driving - May drive tomorrow or in two days, if doing well and not taking pain meds.   Lifting - No lifting more than 15 pounds for 5 days, then no limit.  Wound Care:   Leave the bandage on for 2 days, then you may remove the bandage and shower.  Diet:  As tolerated.  Follow up appointment:  Call Dr. Pollie Friar office [facility]) at [phone] for an appointment in 2 to 3 weeks.  Medications and dosages:  Resume your home medications.  You have a prescription for:  Vicodin.  Call Dr. Lucia Gaskins or his office  [phone]) if you have:  Temperature greater than 100.4,  Persistent nausea and vomiting,  Severe uncontrolled pain,  Redness, tenderness, or signs of infection (pain, swelling, redness, odor or green/yellow discharge around the site),  Difficulty breathing, headache or visual disturbances,  Any other questions or concerns you may have after discharge.  In an emergency, call 911 or go to an Emergency Department at a nearby hospital.     Post Anesthesia Home Care Instructions  Activity: Get plenty of rest for the remainder of the day. A responsible adult should stay with you for 24 hours following the procedure.  For the next 24 hours, DO NOT: -Drive a car -Paediatric nurse -Drink alcoholic beverages -Take any medication unless instructed by your physician -Make any legal decisions or sign important papers.  Meals: Start with liquid foods such as gelatin or soup. Progress to regular foods as tolerated. Avoid greasy, spicy, heavy foods. If nausea and/or vomiting occur, drink only clear liquids until the nausea and/or vomiting subsides. Call your physician if vomiting continues.  Special Instructions/Symptoms: Your throat may feel dry or sore from the anesthesia or the breathing tube placed in your throat during surgery. If this causes discomfort, gargle with warm salt water. The  discomfort should disappear within 24 hours.  If you had a scopolamine patch placed behind your ear for the management of post- operative nausea and/or vomiting:  1. The medication in the patch is effective for 72 hours, after which it should be removed.  Wrap patch in a tissue and discard in the trash. Wash hands thoroughly with soap and water. 2. You may remove the patch earlier than 72 hours if you experience unpleasant side effects which may include dry mouth, dizziness or visual disturbances. 3. Avoid touching the patch. Wash your hands with soap and water after contact with the patch.

## 2016-05-11 NOTE — Op Note (Signed)
05/11/2016  8:44 AM  PATIENT:  Anna Reid DOB: [birth year] MRN: [id number]  PREOP DIAGNOSIS:  RIGHT BREAST CANCER  POSTOP DIAGNOSIS:   Right breast cancer, 1 o'clock subareolar position (Tis, N0)  PROCEDURE:   Procedure(s): RIGHT BREAST LUMPECTOMY WITH RADIOACTIVE SEED LOCALIZATION  SURGEON:   Ovidio Kin, M.D.  ANESTHESIA:   general  Anesthesiologist: Heather Roberts, MD CRNA: Ross Desanctis, CRNA  General  EBL:  minimal  ml  DRAINS: none   LOCAL MEDICATIONS USED:   30 cc of 1/4% marcaine  SPECIMEN:   Right breast lumpectomy (6 color paint)  COUNTS CORRECT:  YES  INDICATIONS FOR PROCEDURE:  Anna Reid is a 45 y.o. (DOB: February 04, 1971) white female whose primary care physician is Pcp Not In System and comes for right breast lumpectomy.   [facility] with Drs. Pamelia Hoit and Moody for a right breast DCIS.     The options for breast cancer treatment have been discussed with the patient. She elected to proceed with right breast lumpectomy.  This will probably be followed by radiation therapy to the right breast.    The indications and potential complications of surgery were explained to the patient. Potential complications include, but are not limited to, bleeding, infection, the need for further surgery, and nerve injury.     She had a I131 seed placed on 05/07/2016 in [facility] right [facility].  I confirmed the presence of the I131 seed in the pre op area using the Neoprobe.  The seed is in the 1 o'clock position of the right breast, at the edge of the areola.  OPERATIVE NOTE:   The patient was taken to room # 8 at [facility] where she underwent a general anesthesia  supervised by Anesthesiologist: Heather Roberts, MD CRNA:  Desanctis, CRNA. Her right breast and axilla were prepped with  ChloraPrep and sterilely draped.    A time-out and the surgical check list was reviewed.    I turned my attention to the cancer  which was about at the 1 o'clock position of the right breast.   The seed actually sat underneath the areola.  I used the Neoprobe to identify the I131 seed.  I tried to excise an area around the tumor of at least 1 cm.    I excised this block of breast tissue approximately 3 cm by 4 cm  in diameter.   I took the breast dissection down to the chest wall.  I painted the lumpectomy specimen with the 6 color paint kit and did a specimen mammogram which confirmed the mass, clip, and the seed were all in the right position in the specimen.  The specimen was sent to pathology who called back to confirm that they have the seed and the specimen.   I then irrigated the wound with saline. I infiltrated approximately 30 mL of 1/4% Marcaine between the incisions.  I placed 4 clips to mark biopsy cavity, at 12, 3, 6, and 9 o'clock.   I then closed all the wounds in layers using 3-0 Vicryl sutures for the deep layer. At the skin, I closed the incisions with a 4-0 Monocryl suture. The incisions were then painted with DermaBond.  She had gauze place over the wounds and placed in a breast binder.   The patient tolerated the procedure well, was transported to the recovery room in good condition. Sponge and needle count were correct at the end of  the case.   Final pathology is pending.   Alphonsa Overall, MD, [facility] Pager: [phone] Office phone:  [phone]

## 2016-05-11 NOTE — Anesthesia Postprocedure Evaluation (Signed)
Anesthesia Post Note  Patient: Anna Reid  Procedure(s) Performed: Procedure(s) (LRB): RIGHT BREAST LUMPECTOMY WITH RADIOACTIVE SEED LOCALIZATION (Right)  Patient location during evaluation: PACU Anesthesia Type: General Level of consciousness: sedated Pain management: pain level controlled Vital Signs Assessment: post-procedure vital signs reviewed and stable Respiratory status: spontaneous breathing and respiratory function stable Cardiovascular status: stable Anesthetic complications: no    Last Vitals:  Vitals:   05/11/16 0930 05/11/16 0950  BP: 114/82 112/75  Pulse: 83 70  Resp: 15 16  Temp:  36.9 C    Last Pain:  Vitals:   05/11/16 0950  TempSrc: Oral  PainSc: 3                  Takeya Marquis DANIEL

## 2016-05-11 NOTE — Anesthesia Procedure Notes (Signed)
Procedure Name: LMA Insertion Date/Time: 05/11/2016 7:42 AM Performed by: Toula Moos L Pre-anesthesia Checklist: Patient identified, Emergency Drugs available, Suction available, Patient being monitored and Timeout performed Patient Re-evaluated:Patient Re-evaluated prior to inductionOxygen Delivery Method: Circle system utilized Preoxygenation: Pre-oxygenation with 100% oxygen Intubation Type: IV induction Ventilation: Mask ventilation without difficulty LMA: LMA inserted LMA Size: 4.0 Number of attempts: 1 Airway Equipment and Method: Bite block Placement Confirmation: positive ETCO2 Tube secured with: Tape Dental Injury: Teeth and Oropharynx as per pre-operative assessment

## 2016-05-11 NOTE — Anesthesia Preprocedure Evaluation (Addendum)
Anesthesia Evaluation  Patient identified by MRN, date of birth, ID band Patient awake    Reviewed: Allergy & Precautions, NPO status , Patient's Chart, lab work & pertinent test results  History of Anesthesia Complications Negative for: history of anesthetic complications  Airway Mallampati: II  TM Distance: >3 FB Neck ROM: Full    Dental no notable dental hx. (+) Dental Advisory Given   Pulmonary neg pulmonary ROS, former smoker,    Pulmonary exam normal        Cardiovascular negative cardio ROS Normal cardiovascular exam     Neuro/Psych  Headaches, negative psych ROS   GI/Hepatic negative GI ROS, Neg liver ROS,   Endo/Other  negative endocrine ROS  Renal/GU negative Renal ROS  negative genitourinary   Musculoskeletal negative musculoskeletal ROS (+)   Abdominal   Peds negative pediatric ROS (+)  Hematology negative hematology ROS (+)   Anesthesia Other Findings   Reproductive/Obstetrics negative OB ROS                            Anesthesia Physical Anesthesia Plan  ASA: II  Anesthesia Plan: General   Post-op Pain Management:    Induction: Intravenous  Airway Management Planned: LMA  Additional Equipment:   Intra-op Plan:   Post-operative Plan: Extubation in OR  Informed Consent: I have reviewed the patients History and Physical, chart, labs and discussed the procedure including the risks, benefits and alternatives for the proposed anesthesia with the patient or authorized representative who has indicated his/her understanding and acceptance.   Dental advisory given  Plan Discussed with: CRNA and Anesthesiologist  Anesthesia Plan Comments:        Anesthesia Quick Evaluation

## 2016-05-12 ENCOUNTER — Encounter (HOSPITAL_BASED_OUTPATIENT_CLINIC_OR_DEPARTMENT_OTHER): Payer: Self-pay | Admitting: Surgery

## 2016-05-25 NOTE — Assessment & Plan Note (Signed)
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 Adj XRT 2. Foll by Tamoxifen 20 mg daily  RTC in 3 months to start Tamoxifen

## 2016-05-29 ENCOUNTER — Ambulatory Visit (HOSPITAL_BASED_OUTPATIENT_CLINIC_OR_DEPARTMENT_OTHER): Payer: BLUE CROSS/BLUE SHIELD | Admitting: Hematology and Oncology

## 2016-05-29 ENCOUNTER — Encounter: Payer: Self-pay | Admitting: Hematology and Oncology

## 2016-05-29 DIAGNOSIS — D0511 Intraductal carcinoma in situ of right breast: Secondary | ICD-10-CM

## 2016-05-29 DIAGNOSIS — Z170 Estrogen receptor positive status [ER+]: Secondary | ICD-10-CM | POA: Diagnosis not present

## 2016-05-29 DIAGNOSIS — C50211 Malignant neoplasm of upper-inner quadrant of right female breast: Secondary | ICD-10-CM

## 2016-05-29 NOTE — Progress Notes (Signed)
Patient Care Team: Pcp Not In System as PCP - General Ena Dawley, MD as Consulting Physician (Obstetrics and Gynecology) Alphonsa Overall, MD as Consulting Physician (General Surgery) Nicholas Lose, MD as Consulting Physician (Hematology and Oncology) Kyung Rudd, MD as Consulting Physician (Radiation Oncology)  DIAGNOSIS:  Encounter Diagnosis  Name Primary?  . 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 The final surgery report revealed that there was no additional DCIS. She is healing very well from the surgery. She denies any problems or concerns.  REVIEW OF SYSTEMS:   Constitutional: Denies fevers, chills or abnormal weight loss Eyes: Denies blurriness of vision Ears, nose, mouth, throat, and face: Denies mucositis or sore throat Respiratory: Denies cough, dyspnea or wheezes Cardiovascular: Denies palpitation, chest discomfort Gastrointestinal:  Denies nausea, heartburn or change in bowel habits Skin: Denies abnormal skin rashes Lymphatics: Denies new lymphadenopathy or easy bruising Neurological:Denies numbness, tingling or new weaknesses Behavioral/Psych: Mood is stable, no new changes  Extremities: No lower extremity edema Breast: Recent right lumpectomy All other systems were reviewed with the patient and are negative.  I  have reviewed the past medical history, past surgical history, social history and family history with the patient and they are unchanged from previous note.  ALLERGIES:  is allergic to penicillins.  MEDICATIONS:  Current Outpatient Prescriptions  Medication Sig Dispense Refill  . HYDROcodone-acetaminophen (NORCO/VICODIN) 5-325 MG tablet Take 1-2 tablets by mouth every 6 (six) hours as needed. 20 tablet 0  . Methylcobalamin (METHYL B-12 PO) Take 2,500 mcg by mouth as needed.     No current facility-administered medications for this visit.     PHYSICAL EXAMINATION: ECOG PERFORMANCE STATUS: 0 - Asymptomatic  Vitals:   05/29/16 0906  BP: 105/67  Pulse: (!) 52  Resp: 18  Temp: 97.7 F (36.5 C)   Filed Weights   05/29/16 0906  Weight: 155 lb 12.8 oz (70.7 kg)    GENERAL:alert, no distress and comfortable SKIN: skin color, texture, turgor are normal, no rashes or significant lesions EYES: normal, Conjunctiva are pink and non-injected, sclera clear OROPHARYNX:no exudate, no erythema and lips, buccal mucosa, and tongue normal  NECK: supple, thyroid normal size, non-tender, without nodularity LYMPH:  no palpable lymphadenopathy in the cervical, axillary or inguinal LUNGS: clear to auscultation and percussion with normal breathing effort HEART: regular rate & rhythm and no murmurs and no lower extremity edema ABDOMEN:abdomen soft, non-tender and normal bowel sounds MUSCULOSKELETAL:no cyanosis of digits and no clubbing  NEURO: alert & oriented x 3 with fluent speech, no focal motor/sensory deficits EXTREMITIES: No lower extremity edema  LABORATORY DATA:  I have reviewed the data as listed   Chemistry      Component Value Date/Time   NA 141 04/08/2016 0815   K 4.2 04/08/2016 0815   CO2 27 04/08/2016 0815   BUN 16.3  04/08/2016 0815   CREATININE 1.0 04/08/2016 0815      Component Value Date/Time   CALCIUM 9.4 04/08/2016 0815   ALKPHOS 76 04/08/2016 0815   AST 15 04/08/2016  0815   ALT <9 04/08/2016 0815   BILITOT 0.70 04/08/2016 0815       Lab Results  Component Value Date   WBC 3.8 (L) 04/08/2016   HGB 14.1 04/08/2016   HCT 42.7 04/08/2016   MCV 91.5 04/08/2016   PLT 205 04/08/2016   NEUTROABS 2.0 04/08/2016    ASSESSMENT & PLAN:  Breast cancer of upper-inner quadrant of right female breast ([place]) 03/31/2016 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 Adj XRT 2. Foll by Tamoxifen 20 mg daily  I discussed with the patient that in low intermediate risk patients certain molecular tests like Oncotype DX could be done to determine the risk and Dr. Lisbeth Renshaw will be the best person to answer those questions.  I discussed the risks and benefits of tamoxifen We discussed the risks and benefits of tamoxifen. These include but not limited to insomnia, hot flashes, mood changes, vaginal dryness, and weight gain. Although rare, serious side effects including endometrial cancer, risk of blood clots were also discussed. We strongly believe that the benefits far outweigh the risks. Patient understands these risks and consented to starting treatment. Planned treatment duration is 5 years.   RTC in 3 months to start Tamoxifen   No orders of the defined types were placed in this encounter.  The patient has a good understanding of the overall plan. she agrees with it. she will call with any problems that may develop before the next visit here.   Rulon Eisenmenger, MD 05/29/16

## 2016-06-02 NOTE — Progress Notes (Signed)
Location of Breast Cancer: Right Breast  Upper Inner Quadrant  Histology per Pathology Report: Diagnosis 03/31/16: Breast, right, needle core biopsy, medial calcs - DUCTAL CARCINOMA IN SITU WITH CALCIFICATIONS  Receptor Status: ER(100%+), PR (95%+), Her2-neu (  ), Ki-(  )  Did patient present with symptoms (if so, please note symptoms) or was this found on screening mammography?: Routine screening   Past/Anticipated interventions by surgeon, if WOE:[id number] 05/11/2016:Dr. Alphonsa Overall, MD Breast, lumpectomy, Right - BENIGN BREAST PARENCHYMA. - THERE IS NO EVIDENCE OF MALIGNANCY  Past/Anticipated interventions by medical oncology, if any: Chemotherapy : Dr. Nicholas Lose, MD :05/29/16 ; to have radiation followed by Tamoxifen 85m daily  X 5 years  Lymphedema issues, if any: No  Pain issues, if any:  Tenderness right breast   SAFETY ISSUES:  Prior radiation?  NO  Pacemaker/ICD?  NO  Possible current pregnancy? No  Is the patient on methotrexate? NO  Current Complaints / other details:  Divorced, Seen in multidisciplinary clinic  04/08/16 Mother Brain  Cancer, oral cancer,  seizure disorder,depression, Father ,sister, Melanoma;  Maternal grandfather lung cancer, Maternal Aunt Breast cancer, Maternal Uncle oral cancer  Allergies:PCNS  BP 107/70 (BP Location: Left Arm, Patient Position: Sitting, Cuff Size: Normal)   Pulse (!) 51   Temp 98.3 F (36.8 C) (Oral)   Resp 16   Ht 5' 2.5" (1.588 m)   Wt 153 lb 12.8 oz (69.8 kg)   BMI 27.68 kg/m   Wt Readings from Last 3 Encounters:  06/04/16 153 lb 12.8 oz (69.8 kg)  05/29/16 155 lb 12.8 oz (70.7 kg)  05/11/16 156 lb (70.8 kg)    MRebecca Eaton RN 06/02/2016,10:38 AM

## 2016-06-04 ENCOUNTER — Ambulatory Visit
Admission: RE | Admit: 2016-06-04 | Discharge: 2016-06-04 | Disposition: A | Discharge status: Home / Self Care | Payer: BLUE CROSS/BLUE SHIELD | Source: Ambulatory Visit | Attending: Radiation Oncology | Admitting: Radiation Oncology

## 2016-06-04 ENCOUNTER — Encounter: Payer: Self-pay | Admitting: Radiation Oncology

## 2016-06-04 VITALS — BP 107/70 | HR 51 | Temp 98.3°F | Resp 16 | Ht 62.5 in | Wt 153.8 lb

## 2016-06-04 DIAGNOSIS — Z808 Family history of malignant neoplasm of other organs or systems: Secondary | ICD-10-CM | POA: Diagnosis not present

## 2016-06-04 DIAGNOSIS — D0511 Intraductal carcinoma in situ of right breast: Secondary | ICD-10-CM | POA: Insufficient documentation

## 2016-06-04 DIAGNOSIS — C50211 Malignant neoplasm of upper-inner quadrant of right female breast: Secondary | ICD-10-CM

## 2016-06-04 DIAGNOSIS — Z170 Estrogen receptor positive status [ER+]: Secondary | ICD-10-CM | POA: Insufficient documentation

## 2016-06-04 DIAGNOSIS — Z87891 Personal history of nicotine dependence: Secondary | ICD-10-CM | POA: Insufficient documentation

## 2016-06-04 DIAGNOSIS — Z880 Allergy status to penicillin: Secondary | ICD-10-CM | POA: Diagnosis not present

## 2016-06-04 DIAGNOSIS — Z803 Family history of malignant neoplasm of breast: Secondary | ICD-10-CM | POA: Diagnosis not present

## 2016-06-04 DIAGNOSIS — Z8042 Family history of malignant neoplasm of prostate: Secondary | ICD-10-CM | POA: Diagnosis not present

## 2016-06-04 NOTE — Progress Notes (Addendum)
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 The patient was originally seen in multidisciplinary clinic on 04/08/16. She was felt to be a good candidate for breast conservation treatment.  She has completed surgery consisting of a right lumpectomy on 05/11/16. This revealed benign breast parenchyma and no evidence of malignancy. The patient's biopsy on 03/31/16 revealed low to intermediate grade DCIS with calcifications (ER 100% +, PR 95% +). The patient followed up with Dr. Lucia Gaskins on 05/27/16 (who recommended a 6 month follow up) and Dr. Lindi Adie on 05/29/16 At that visit there was much discussion about the utility of antiestrogen therapy.   On review of systems, the patient reports that she is doing well since surgery, but has been frustrated and angry about not understanding why she's had mixed opinion on adjuvant therapy. She denies any chest pain, shortness of breath, cough, fevers, chills, night sweats, unintended weight changes. She denies any bowel or bladder disturbances, and denies abdominal pain, nausea or vomiting. She denies any new musculoskeletal or joint aches or pains. A complete review of systems is obtained and is otherwise negative.  Past Medical History:  Past Medical History:  Diagnosis Date  . Abnormal Pap smear    ASC-US  . Family history of brain cancer   . Family history of breast cancer   . Family history of prostate cancer   . Fatigue   . [id number](812.7)     Past Surgical History: Past Surgical History:  Procedure Laterality Date  . BREAST LUMPECTOMY WITH RADIOACTIVE SEED LOCALIZATION Right 05/11/2016   Procedure: RIGHT  BREAST LUMPECTOMY WITH RADIOACTIVE SEED LOCALIZATION;  Surgeon: Alphonsa Overall, MD;  Location: [facility];  Service: General;  Laterality: Right;  . DIAGNOSTIC LAPAROSCOPY    . EYE SURGERY     strabismus surgery as child  . STRABISMUS SURGERY    . TONSILLECTOMY     as child    Social History:  Social History   Social History  . Marital status: Divorced    Spouse name: N/A  . Number of children: 2  . Years of education: N/A   Occupational History  . Not on file.   Social History Main Topics  . Smoking status: Former Smoker    Packs/day: 1.00    Types: Cigarettes  . Smokeless tobacco: Never Used  . Alcohol use No  . Drug use: No  . Sexual activity: Yes   Other Topics Concern  . Not on file   Social History Narrative  . No narrative on file  The patient lives in [place]. She is a English as a second language teacher for [employer]  Family History: Family History  Problem Relation Age of Onset  . Brain cancer Mother 13    glioblastoma  . Other Mother     oral cancer  . Lung cancer Maternal Grandfather   . Breast cancer Maternal Aunt 74  . Other Maternal Uncle     oral cancer  . Dementia Maternal Grandmother   . Diabetes Paternal Grandmother   . Congestive Heart Failure Paternal Grandmother   . Prostate cancer Paternal Grandfather   . Other Maternal Uncle     oral cancer  .  Albinism Son     ocular albinism - x linked  . Spina bifida Son     ALLERGIES:  is allergic to penicillins.  Meds: Current Outpatient Prescriptions  Medication Sig Dispense Refill  . Methylcobalamin (METHYL B-12 PO) Take 2,500 mcg by mouth as needed.    Marland Kitchen HYDROcodone-acetaminophen (NORCO/VICODIN) 5-325 MG tablet Take 1-2 tablets by mouth every 6 (six) hours as needed. (Patient not taking: Reported on 06/04/2016) 20 tablet 0   No current facility-administered medications for this encounter.     Physical Findings:  height is 5' 2.5" (1.588 m) and weight is 153 lb 12.8 oz (69.8 kg). Her oral  temperature is 98.3 F (36.8 C). Her blood pressure is 107/70 and her pulse is 51 (abnormal). Her respiration is 16.  In general this is a well appearing Caucasian female who was teary eyed and anxious. She's alert and oriented x4 and appropriate throughout the examination. Cardiopulmonary assessment is negative for acute distress and she exhibits normal effort. Breast exam is deferred.  Lab Findings: Lab Results  Component Value Date   WBC 3.8 (L) 04/08/2016   HGB 14.1 04/08/2016   HCT 42.7 04/08/2016   MCV 91.5 04/08/2016   PLT 205 04/08/2016     Radiographic Findings: Mm Breast Surgical Specimen  Result Date: 05/11/2016 CLINICAL DATA:  Specimen radiograph status post right breast lumpectomy. EXAM: SPECIMEN RADIOGRAPH OF THE RIGHT BREAST COMPARISON:  Previous exam(s). FINDINGS: Status post excision of the right breast. The radioactive seed and biopsy marker clip are present, completely intact, and were marked for pathology. These findings were communicated with the OR at 8:15 a.m. IMPRESSION: Specimen radiograph of the right breast. Electronically Signed   By: Ammie Ferrier M.D.   On: 05/11/2016 08:16   Mm Rt Radioactive Seed Loc Mammo Guide  Result Date: 05/07/2016 CLINICAL DATA:  Localization for surgery EXAM: MAMMOGRAPHIC GUIDED RADIOACTIVE SEED LOCALIZATION OF THE RIGHT BREAST COMPARISON:  Previous exam(s). FINDINGS: Patient presents for radioactive seed localization prior to surgery. I met with the patient and we discussed the procedure of seed localization including benefits and alternatives. We discussed the high likelihood of a successful procedure. We discussed the risks of the procedure including infection, bleeding, tissue injury and further surgery. We discussed the low dose of radioactivity involved in the procedure. Informed, written consent was given. The usual time-out protocol was performed immediately prior to the procedure. Using mammographic guidance, sterile  technique, 1% lidocaine and an I-125 radioactive seed, the biopsy clip was localized using a medial approach. The follow-up mammogram images confirm the seed in the expected location and were marked for the surgeon. Follow-up survey of the patient confirms presence of the radioactive seed. Order number of I-125 seed:  [id number]. Total activity:  1.017 millicuries  Reference Date: April 17, 2016 The patient tolerated the procedure well and was released from the [facility]. She was given instructions regarding seed removal. IMPRESSION: Radioactive seed localization right breast. No apparent complications. Electronically Signed   By: Dorise Bullion III M.D   On: 05/07/2016 13:35    Impression/Plan:  1. 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 Dr. Lisbeth Renshaw reviews the findings on her pathology report and outlines that per NCCN guidelines, he would still recommend proceeding with radiotherapy. The patient is contemplating whether or not to proceed with radiation and adjuvant hormonal therapy. I discussed that the standard of care is radiation followed by adjuvant hormonal therapy given that her biopsy was positive for DCIS (ER/PR+). The  patient is also concerned with radiation causing secondary malignancies. We discussed with the patient the role of radiation treatment in this setting. We discussed the expected benefit in terms of local/regional control area we also discussed the possible side effects and risks of treatment. We discussed that there is a risk of secondary malignancies, but the risk is small and there is a greater benefit to do radiation. We discussed the logistics of treatment. We also discussed a referral for a second opinion, but she declined one at this time. The patient would like some time to decide whether she wants to proceed with radiation and hormonal therapy. She will call our clinic once she's decided how she wants to proceed. I will call her next week regardless to  check in.  In a visit lasting 60 minutes, greater than 50% of the time was spent face to face discussing her reservations and concerns about misinformation/differences in clinical oppinion, and coordinating the patient's care.   The above documentation reflects my direct findings during this shared patient visit. Please see the separate note by Dr. Lisbeth Renshaw on this date for the remainder of the patient's plan of care.   Carola Rhine, PAC  This document serves as a record of services personally performed by Shona Simpson, PA-C and Kyung Rudd, MD. It was created on their behalf by Darcus Austin, a trained medical scribe. The creation of this record is based on the scribe's personal observations and the providers' statements to them. This document has been checked and approved by the attending provider.

## 2016-06-26 ENCOUNTER — Telehealth: Payer: Self-pay | Admitting: Radiation Oncology

## 2016-06-26 NOTE — Telephone Encounter (Signed)
LM for patient to call back to discuss options for treatment.

## 2016-07-01 ENCOUNTER — Telehealth: Payer: Self-pay | Admitting: Radiation Oncology

## 2016-07-01 DIAGNOSIS — Z170 Estrogen receptor positive status [ER+]: Principal | ICD-10-CM

## 2016-07-01 DIAGNOSIS — C50211 Malignant neoplasm of upper-inner quadrant of right female breast: Secondary | ICD-10-CM

## 2016-07-01 NOTE — Telephone Encounter (Signed)
I spoke with the patient regarding her thoughts on moving forward with either radiotherapy or a second opinion or surveillance. At this time she's not interested in radiation treatment, and would rather follow up in surveillance without antiestrogen medication. She would like to be referred to Dr. Marin Olp to establish her care as she lives in [place], but works in [employer]. I will place a referral and she is encouraged to keep [place] informed of any questions or concerns she has regarding the previous clinic discussion we've had.

## 2016-07-31 ENCOUNTER — Ambulatory Visit: Payer: BLUE CROSS/BLUE SHIELD

## 2016-07-31 ENCOUNTER — Other Ambulatory Visit: Payer: Self-pay | Admitting: *Deleted

## 2016-07-31 ENCOUNTER — Other Ambulatory Visit: Payer: BLUE CROSS/BLUE SHIELD

## 2016-07-31 ENCOUNTER — Ambulatory Visit (HOSPITAL_BASED_OUTPATIENT_CLINIC_OR_DEPARTMENT_OTHER): Payer: BLUE CROSS/BLUE SHIELD | Admitting: Hematology & Oncology

## 2016-07-31 VITALS — BP 115/63 | HR 57 | Temp 97.6°F | Resp 18 | Wt 153.1 lb

## 2016-07-31 DIAGNOSIS — Z803 Family history of malignant neoplasm of breast: Secondary | ICD-10-CM | POA: Diagnosis not present

## 2016-07-31 DIAGNOSIS — D0511 Intraductal carcinoma in situ of right breast: Secondary | ICD-10-CM

## 2016-08-03 NOTE — Progress Notes (Signed)
Hematology and Oncology Follow Up Visit  Anna Reid [id number] 05-Dec-1970 46 y.o. 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 She was diagnosed with ductal carcinoma in situ back in September. She had a routine mammogram. This shows some suspicious calcifications in the right breast.   She then underwent a biopsy. This is done on September 26. The pathology report [id number]) showed ductal carcinoma in situ. There is no mention of any comedonecrosis. It was felt that this was low-grade to intermediate grade. It was positive for estrogen and progesterone.  She then was seen by Anna Reid of surgery. He went ahead and did a lumpectomy. This is done on November 6. The pathology report ([id number]) no evidence of residual carcinoma in situ. The estimated tumor size was 4 mm. All margins were negative. Some necrosis was noted.  She was seen by AnnaGudena at the breast clinic. He recommended tamoxifen and radiation. If she saw Anna Reid of radiation oncology. He said that radiation would be reasonable but if she did not wish to have radiation, then that would also be justified.  She has a history of breast cancer in the family. Her mother had breast cancer but was postmenopausal. She was tested for BRCA mutations and was found to be negative.  She has 2 children. 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  She does have erratic cycles. Age at menarche was 46 years old. She had her first child when she was 60 years old.  She used to smoke. This was quite a while ago. There is no significant alcohol use. She has no occupational exposures.  She does exercise. She is quite fit.  Overall, her performance status is ECOG 0.   Medications:  Current Outpatient Prescriptions:  Marland Kitchen  Methylcobalamin (METHYL B-12 PO), Take 2,500  mcg by mouth as needed., Disp: , Rfl:   Allergies:  Allergies  Allergen Reactions  . Penicillins     Past Medical History, Surgical history, Social history, and Family History were reviewed and updated.  Review of Systems: As above  Physical Exam:  weight is 153 lb 1.9 oz (69.5 kg). Her oral temperature is 97.6 F (36.4 C). Her blood pressure is 115/63 and her pulse is 57 (abnormal). Her respiration is 18 and oxygen saturation is 99%.   Wt Readings from Last 3 Encounters:  07/31/16 153 lb 1.9 oz (69.5 kg)  06/04/16 153 lb 12.8 oz (69.8 kg)  05/29/16 155 lb 12.8 oz (70.7 kg)     Well-developed and well-nourished white female in no obvious distress. Head and neck exam shows no ocular or oral lesions. She has no palpable cervical or supra clavicular lymph nodes. Lungs are clear bilaterally. Cardiac exam regular rate and rhythm with no murmurs, rubs or bruits. Breast exam shows left breast with no masses, edema or erythema. There is no left axillary adenopathy. Right breast shows the healed lumpectomy at the 3:00 position. This is at the edge of the areal. Some slight firmness is noted at the lumpectomy site. No obvious masses noted in the right breast. There is no right axillary adenopathy. Abdomen is soft. She is good bowel sounds. There is no fluid wave. There is no palpable liver or spleen tip. Back exam shows no kyphosis. There is no osteoporotic changes.  Extremity shows no clubbing, cyanosis or edema. Neurological exam shows no focal neurological deficits.  Lab Results  Component Value Date   WBC 3.8 (L) 04/08/2016   HGB 14.1 04/08/2016   HCT 42.7 04/08/2016   MCV 91.5 04/08/2016   PLT 205 04/08/2016     Chemistry      Component Value Date/Time   NA 141 04/08/2016 0815   K 4.2 04/08/2016 0815   CO2 27 04/08/2016 0815   BUN 16.3 04/08/2016 0815   CREATININE 1.0 04/08/2016 0815      Component Value Date/Time   CALCIUM 9.4 04/08/2016 0815   ALKPHOS 76 04/08/2016 0815    AST 15 04/08/2016 0815   ALT <9 04/08/2016 0815   BILITOT 0.70 04/08/2016 0815         Impression and Plan: Anna Reid is 46 year old premenopausal white female. She has ductal carcinoma in situ of the right breast. I would have to say this would be low risk. Her tumor is only 4 mm in size. All margins are negative and fairly wide.  I think that it would be reasonable for her to be observed. I not sure what we would be gaining by having her on tamoxifen. She is negative for BRCA so I would not put her in a high risk group for contralateral disease.  Given that her tumor is only 4 mm in size, and the fat of the margins are clean, I am not sure what radiation therapy would accomplish. Radiation has not been shown to affect the overall survival. There might be a slight increase in local recurrent recurrence but again I with a with a 4 mm tumor, that the risk of local recurrence would be less than 10%.  She would feel much more comfortable just being observed.  We can get on a surveillance program. I would get a mammogram in April. This would be about 6 months after her initial abnormal mammogram. If this mammogram looked okay, then I would think yearly mammograms would be reasonable.  Because she works in [employer], she would like to continue to see [place]. It is convenient for her as we are on the way to her company worksite.  I spent about an hour with her. She is very nice. She is very knowledgeable. She enjoyed our talk. I just want to make her life as less complicated as possible. With 2 teenagers, she certainly has her hands full.  I did tell her to take 2 values of vitamin D daily.  I answered all of her questions.  Anna Napoleon, MD 1/29/20187:18 AM

## 2016-08-04 ENCOUNTER — Telehealth: Payer: Self-pay | Admitting: Hematology and Oncology

## 2016-08-04 NOTE — Telephone Encounter (Signed)
Patient called to cancel appointment. Does not wish to reschedule appointment at this time.

## 2016-08-17 ENCOUNTER — Encounter (HOSPITAL_COMMUNITY): Payer: Self-pay

## 2016-08-18 ENCOUNTER — Encounter (HOSPITAL_COMMUNITY): Payer: Self-pay

## 2016-08-24 ENCOUNTER — Ambulatory Visit: Payer: Self-pay | Admitting: Hematology and Oncology

## 2016-08-25 ENCOUNTER — Other Ambulatory Visit: Payer: Self-pay | Admitting: Hematology & Oncology

## 2016-08-25 DIAGNOSIS — D0511 Intraductal carcinoma in situ of right breast: Secondary | ICD-10-CM

## 2016-09-15 ENCOUNTER — Ambulatory Visit
Admission: RE | Admit: 2016-09-15 | Discharge: 2016-09-15 | Disposition: A | Discharge status: Home / Self Care | Payer: BLUE CROSS/BLUE SHIELD | Source: Ambulatory Visit | Attending: Hematology & Oncology | Admitting: Hematology & Oncology

## 2016-09-15 DIAGNOSIS — D0511 Intraductal carcinoma in situ of right breast: Secondary | ICD-10-CM

## 2016-10-26 ENCOUNTER — Ambulatory Visit: Payer: Self-pay | Admitting: Hematology & Oncology

## 2016-10-26 ENCOUNTER — Ambulatory Visit (HOSPITAL_BASED_OUTPATIENT_CLINIC_OR_DEPARTMENT_OTHER): Payer: BLUE CROSS/BLUE SHIELD | Admitting: Hematology & Oncology

## 2016-10-26 VITALS — BP 108/71 | HR 81 | Temp 98.1°F | Resp 19 | Wt 150.0 lb

## 2016-10-26 DIAGNOSIS — Z170 Estrogen receptor positive status [ER+]: Principal | ICD-10-CM

## 2016-10-26 DIAGNOSIS — D0511 Intraductal carcinoma in situ of right breast: Secondary | ICD-10-CM | POA: Diagnosis not present

## 2016-10-26 DIAGNOSIS — C50211 Malignant neoplasm of upper-inner quadrant of right female breast: Secondary | ICD-10-CM

## 2016-10-26 DIAGNOSIS — M818 Other osteoporosis without current pathological fracture: Secondary | ICD-10-CM

## 2016-10-26 NOTE — Progress Notes (Signed)
Hematology and Oncology Follow Up Visit  Anna Reid [id number] 1971-01-02 46 y.o. 10/26/2016   Principle Diagnosis:   Ductal carcinoma in situ of the right breast  Current Therapy:    Observation     Interim History:  Anna Reid is comes in for her second office visit. We first saw her back in January. Everything has been doing well since we last saw her.  She is taking vitamin D right now.  She is exercising. She is working.  She and her 2 children are going to [place] for July 4. I'm sure they will have a great time up there.  She had a mammogram in March. Everything looked fine on the mammogram.  There's been no fever. She's had no nausea or vomiting. She's had no cough.  Overall, her performance status is ECOG 0.  Medications:  Current Outpatient Prescriptions:  .  cholecalciferol (VITAMIN D) 400 units TABS tablet, Take 400 Units by mouth., Disp: , Rfl:  .  Methylcobalamin (METHYL B-12 PO), Take 2,500 mcg by mouth as needed., Disp: , Rfl:   Allergies:  Allergies  Allergen Reactions  . Penicillins     Past Medical History, Surgical history, Social history, and Family History were reviewed and updated.  Review of Systems: As above  Physical Exam:  weight is 150 lb (68 kg). Her oral temperature is 98.1 F (36.7 C). Her blood pressure is 108/71 and her pulse is 81. Her respiration is 19 and oxygen saturation is 99%.   Wt Readings from Last 3 Encounters:  10/26/16 150 lb (68 kg)  07/31/16 153 lb 1.9 oz (69.5 kg)  06/04/16 153 lb 12.8 oz (69.8 kg)     Head and neck exam shows no ocular or oral lesions. She has no palpable cervical or supra clavicular lymph nodes. Lungs are clear bilaterally. Cardiac exam regular rate and rhythm with no murmurs, rubs or bruits. Breast exam shows left breast with no masses, edema or erythema. There is no left axillary adenopathy. Right breast shows the healed lumpectomy at the 3:00 position. This is at the edge of the  areal. Some slight firmness is noted at the lumpectomy site. No obvious masses noted in the right breast. There is no right axillary adenopathy. Abdomen is soft. She is good bowel sounds. There is no fluid wave. There is no palpable liver or spleen tip. Back exam shows no kyphosis. There is no osteoporotic changes. Extremity shows no clubbing, cyanosis or edema. Neurological exam shows no focal neurological deficits.  Lab Results  Component Value Date   WBC 3.8 (L) 04/08/2016   HGB 14.1 04/08/2016   HCT 42.7 04/08/2016   MCV 91.5 04/08/2016   PLT 205 04/08/2016     Chemistry      Component Value Date/Time   NA 141 04/08/2016 0815   K 4.2 04/08/2016 0815   CO2 27 04/08/2016 0815   BUN 16.3 04/08/2016 0815   CREATININE 1.0 04/08/2016 0815      Component Value Date/Time   CALCIUM 9.4 04/08/2016 0815   ALKPHOS 76 04/08/2016 0815   AST 15 04/08/2016 0815   ALT <9 04/08/2016 0815   BILITOT 0.70 04/08/2016 0815         Impression and Plan: Anna Reid is a 46 year old white female. She is perimenopausal. From my point of view, everything looks fine. I do not see anything that suggests recurrence.  She does not wish to be on any type of adjuvant therapy. I think this  is very reasonable. Her DCIS was only 4 mm. There are no high-risk features. Margins were all negative.  We'll see her back in 6 months. I did this would be a reasonable amount of time for follow-up.   Volanda Napoleon, MD 4/23/20189:15 AM

## 2017-04-26 ENCOUNTER — Ambulatory Visit (HOSPITAL_BASED_OUTPATIENT_CLINIC_OR_DEPARTMENT_OTHER): Payer: BLUE CROSS/BLUE SHIELD | Admitting: Family

## 2017-04-26 ENCOUNTER — Other Ambulatory Visit (HOSPITAL_BASED_OUTPATIENT_CLINIC_OR_DEPARTMENT_OTHER): Payer: BLUE CROSS/BLUE SHIELD

## 2017-04-26 VITALS — BP 105/68 | HR 52 | Temp 97.6°F | Resp 16 | Wt 153.0 lb

## 2017-04-26 DIAGNOSIS — D0511 Intraductal carcinoma in situ of right breast: Secondary | ICD-10-CM

## 2017-04-26 DIAGNOSIS — Z170 Estrogen receptor positive status [ER+]: Secondary | ICD-10-CM

## 2017-04-26 DIAGNOSIS — C50211 Malignant neoplasm of upper-inner quadrant of right female breast: Secondary | ICD-10-CM

## 2017-04-26 DIAGNOSIS — M818 Other osteoporosis without current pathological fracture: Secondary | ICD-10-CM

## 2017-04-26 LAB — CBC WITH DIFFERENTIAL (CANCER CENTER ONLY)
BASO#: 0 10*3/uL (ref 0.0–0.2)
BASO%: 0.6 % (ref 0.0–2.0)
EOS%: 2.8 % (ref 0.0–7.0)
Eosinophils Absolute: 0.1 10*3/uL (ref 0.0–0.5)
HEMATOCRIT: 41.5 % (ref 34.8–46.6)
HGB: 14.3 g/dL (ref 11.6–15.9)
LYMPH#: 1.3 10*3/uL (ref 0.9–3.3)
LYMPH%: 41.5 % (ref 14.0–48.0)
MCH: 31.2 pg (ref 26.0–34.0)
MCHC: 34.5 g/dL (ref 32.0–36.0)
MCV: 91 fL (ref 81–101)
MONO#: 0.3 10*3/uL (ref 0.1–0.9)
MONO%: 9.9 % (ref 0.0–13.0)
NEUT#: 1.5 10*3/uL (ref 1.5–6.5)
NEUT%: 45.2 % (ref 39.6–80.0)
PLATELETS: 225 10*3/uL (ref 145–400)
RBC: 4.58 10*6/uL (ref 3.70–5.32)
RDW: 12 % (ref 11.1–15.7)
WBC: 3.2 10*3/uL — ABNORMAL LOW (ref 3.9–10.0)

## 2017-04-26 LAB — CMP (CANCER CENTER ONLY)
ALK PHOS: 74 U/L (ref 26–84)
ALT: 18 U/L (ref 10–47)
AST: 24 U/L (ref 11–38)
Albumin: 4.2 g/dL (ref 3.3–5.5)
BUN: 9 mg/dL (ref 7–22)
CALCIUM: 9.5 mg/dL (ref 8.0–10.3)
CO2: 31 meq/L (ref 18–33)
Chloride: 103 mEq/L (ref 98–108)
Creat: 1 mg/dl (ref 0.6–1.2)
GLUCOSE: 116 mg/dL (ref 73–118)
POTASSIUM: 4 meq/L (ref 3.3–4.7)
Sodium: 145 mEq/L (ref 128–145)
Total Bilirubin: 1 mg/dl (ref 0.20–1.60)
Total Protein: 7.6 g/dL (ref 6.4–8.1)

## 2017-04-26 NOTE — Progress Notes (Signed)
Hematology and Oncology Follow Up Visit  Anna Reid [id number] 09/29/1970 46 y.o. 04/26/2017   Principle Diagnosis:  Ductal carcinoma in situ of the right breast  Current Therapy:   Observation   Interim History:  Anna Reid is is here today for follow-up. She is doing well and has no complaints at this time.  Mammogram in March was negative. Breast exam today was also negative. No mass, lesion or rash.  She had an abnormal pap and will be having a colposcopy next week with her gynecologist.  She is taking her vitamin D 2,000 units PO daily. Level today is 53.2.  No fever, chills, n/v, cough, rash, dizziness, SOB, chest pain, palpitations, abdominal pain or changes in bowel or bladder habits.  No swelling, tenderness, numbness or tingling in her extremities. No c/o pain.  She has maintained a good appetite and is staying well hydrated. Her weight is stable.   ECOG Performance Status: 0 - Asymptomatic  Medications:  Allergies as of 04/26/2017      Reactions   Penicillins       Medication List       Accurate as of 04/26/17  9:17 AM. Always use your most recent med list.          cholecalciferol 400 units Tabs tablet Commonly known as:  VITAMIN D Take 400 Units by mouth.   METHYL B-12 PO Take 2,500 mcg by mouth as needed.       Allergies:  Allergies  Allergen Reactions  . Penicillins     Past Medical History, Surgical history, Social history, and Family History were reviewed and updated.  Review of Systems: All other 10 point review of systems is negative.   Physical Exam:  weight is 153 lb (69.4 kg). Her oral temperature is 97.6 F (36.4 C). Her blood pressure is 105/68 and her pulse is 52 (abnormal). Her respiration is 16 and oxygen saturation is 100%.   Wt Readings from Last 3 Encounters:  04/26/17 153 lb (69.4 kg)  10/26/16 150 lb (68 kg)  07/31/16 153 lb 1.9 oz (69.5 kg)    Ocular: Sclerae unicteric, pupils equal, round and reactive to  light Ear-nose-throat: Oropharynx clear, dentition fair Lymphatic: No cervical, supraclavicular or axillary adenopathy Lungs no rales or rhonchi, good excursion bilaterally Heart regular rate and rhythm, no murmur appreciated Abd soft, nontender, positive bowel sounds, no liver or spleen tip palpated on exam, no fluid wave MSK no focal spinal tenderness, no joint edema Neuro: non-focal, well-oriented, appropriate affect Breasts: Left breast exam was negative. Right breast lumpectomy scar at the 3 o'clock position intact. No mass, lesion or rash present on exam.   Lab Results  Component Value Date   WBC 3.2 (L) 04/26/2017   HGB 14.3 04/26/2017   HCT 41.5 04/26/2017   MCV 91 04/26/2017   PLT 225 04/26/2017   No results found for: FERRITIN, IRON, TIBC, UIBC, IRONPCTSAT Lab Results  Component Value Date   RBC 4.58 04/26/2017   No results found for: KPAFRELGTCHN, LAMBDASER, KAPLAMBRATIO No results found for: IGGSERUM, IGA, IGMSERUM No results found for: Odetta Pink, SPEI   Chemistry      Component Value Date/Time   NA 141 04/08/2016 0815   K 4.2 04/08/2016 0815   CO2 27 04/08/2016 0815   BUN 16.3 04/08/2016 0815   CREATININE 1.0 04/08/2016 0815      Component Value Date/Time   CALCIUM 9.4 04/08/2016 0815   ALKPHOS 76 04/08/2016  0815   AST 15 04/08/2016 0815   ALT <9 04/08/2016 0815   BILITOT 0.70 04/08/2016 0815      Impression and Plan: Anna Reid is a very pleasant 46 yo caucasian perimenopausal female with history of ductal carcinoma in situ or the right breast with lumpectomy. She is doing well and has no complaints at this time.  Exam today was negative. She will continue on her daily vitamin D.  We will plan to see her back again in another 6 months for follow-up and lab. Mammogram was ordered for March 2019.  She will contact our office with any questions or concerns. We can certainly see her sooner if need be.    Eliezer Bottom, NP 10/22/20189:17 AM

## 2017-04-27 LAB — VITAMIN D 25 HYDROXY (VIT D DEFICIENCY, FRACTURES): VIT D 25 HYDROXY: 53.2 ng/mL (ref 30.0–100.0)

## 2017-05-04 ENCOUNTER — Other Ambulatory Visit: Payer: Self-pay | Admitting: Obstetrics and Gynecology

## 2017-09-17 ENCOUNTER — Ambulatory Visit
Admission: RE | Admit: 2017-09-17 | Discharge: 2017-09-17 | Disposition: A | Discharge status: Home / Self Care | Payer: BLUE CROSS/BLUE SHIELD | Source: Ambulatory Visit | Attending: Family | Admitting: Family

## 2017-09-17 DIAGNOSIS — Z170 Estrogen receptor positive status [ER+]: Principal | ICD-10-CM

## 2017-09-17 DIAGNOSIS — C50211 Malignant neoplasm of upper-inner quadrant of right female breast: Secondary | ICD-10-CM

## 2017-10-11 ENCOUNTER — Other Ambulatory Visit: Payer: Self-pay

## 2017-10-11 ENCOUNTER — Inpatient Hospital Stay: Payer: BLUE CROSS/BLUE SHIELD | Attending: Family | Admitting: Family

## 2017-10-11 ENCOUNTER — Inpatient Hospital Stay: Payer: BLUE CROSS/BLUE SHIELD

## 2017-10-11 VITALS — BP 106/69 | HR 62 | Temp 98.3°F | Resp 16 | Wt 166.0 lb

## 2017-10-11 DIAGNOSIS — D0511 Intraductal carcinoma in situ of right breast: Secondary | ICD-10-CM | POA: Diagnosis not present

## 2017-10-11 DIAGNOSIS — Z170 Estrogen receptor positive status [ER+]: Principal | ICD-10-CM

## 2017-10-11 DIAGNOSIS — Z171 Estrogen receptor negative status [ER-]: Secondary | ICD-10-CM

## 2017-10-11 DIAGNOSIS — E559 Vitamin D deficiency, unspecified: Secondary | ICD-10-CM

## 2017-10-11 DIAGNOSIS — C50211 Malignant neoplasm of upper-inner quadrant of right female breast: Secondary | ICD-10-CM

## 2017-10-11 LAB — CMP (CANCER CENTER ONLY)
ALT: 12 U/L (ref 0–55)
AST: 19 U/L (ref 5–34)
Albumin: 4.3 g/dL (ref 3.5–5.0)
Alkaline Phosphatase: 69 U/L (ref 40–150)
Anion gap: 9 (ref 3–11)
BUN: 11 mg/dL (ref 7–26)
CO2: 28 mmol/L (ref 22–29)
CREATININE: 0.97 mg/dL (ref 0.60–1.10)
Calcium: 9.6 mg/dL (ref 8.4–10.4)
Chloride: 102 mmol/L (ref 98–109)
GFR, Est AFR Am: >60 mL/min (ref >60)
Glucose, Bld: 108 mg/dL (ref 70–140)
Potassium: 3.9 mmol/L (ref 3.5–5.1)
Sodium: 139 mmol/L (ref 136–145)
TOTAL PROTEIN: 7.3 g/dL (ref 6.4–8.3)
Total Bilirubin: 0.6 mg/dL (ref 0.2–1.2)

## 2017-10-11 LAB — CBC WITH DIFFERENTIAL (CANCER CENTER ONLY)
BASOS ABS: 0 10*3/uL (ref 0.0–0.1)
Basophils Relative: 0 %
EOS ABS: 0.2 10*3/uL (ref 0.0–0.5)
EOS PCT: 4 %
HCT: 40.2 % (ref 34.8–46.6)
Hemoglobin: 14.1 g/dL (ref 11.6–15.9)
LYMPHS ABS: 1.9 10*3/uL (ref 0.9–3.3)
Lymphocytes Relative: 39 %
MCH: 31.5 pg (ref 26.0–34.0)
MCHC: 35.1 g/dL (ref 32.0–36.0)
MCV: 89.9 fL (ref 81.0–101.0)
Monocytes Absolute: 0.4 10*3/uL (ref 0.1–0.9)
Monocytes Relative: 9 %
Neutro Abs: 2.3 10*3/uL (ref 1.5–6.5)
Neutrophils Relative %: 48 %
PLATELETS: 212 10*3/uL (ref 145–400)
RBC: 4.47 MIL/uL (ref 3.70–5.32)
RDW: 12.1 % (ref 11.1–15.7)
WBC: 4.8 10*3/uL (ref 3.9–10.0)

## 2017-10-11 NOTE — Progress Notes (Signed)
Hematology and Oncology Follow Up Visit  Anna Reid [id number] 1970-08-02 47 y.o. 10/11/2017   Principle Diagnosis:  Ductal carcinoma in situ of the right breast  Current Therapy:   Observation   Interim History:  Anna Reid is here today for follow-up. She is doing well and has no complaints at this time. She had her mammogram 3 weeks ago which was negative.  Breast exam today was unremarkable.  He has had no fever, chills, n/v, cough, rash, dizziness, SOB, chest pain, palpitations, abdominal pain or changes in bowel or bladder habits.  No swelling, tenderness, numbness or tingling in her extremities. No c/o pain.  She is walking some for exercise.  She has maintained a good appetite and is staying well hydrated. Her weight is stable.   ECOG Performance Status: 0 - Asymptomatic  Medications:  Allergies as of 10/11/2017      Reactions   Penicillins       Medication List        Accurate as of 10/11/17 10:32 AM. Always use your most recent med list.          cholecalciferol 400 units Tabs tablet Commonly known as:  VITAMIN D Take 400 Units by mouth.   METHYL B-12 PO Take 2,500 mcg by mouth as needed.       Allergies:  Allergies  Allergen Reactions  . Penicillins     Past Medical History, Surgical history, Social history, and Family History were reviewed and updated.  Review of Systems: All other 10 point review of systems is negative.   Physical Exam:  vitals were not taken for this visit.   Wt Readings from Last 3 Encounters:  04/26/17 153 lb (69.4 kg)  10/26/16 150 lb (68 kg)  07/31/16 153 lb 1.9 oz (69.5 kg)    Ocular: Sclerae unicteric, pupils equal, round and reactive to light Ear-nose-throat: Oropharynx clear, dentition fair Lymphatic: No cervical, supraclavicular or axillary adenopathy Lungs no rales or rhonchi, good excursion bilaterally Heart regular rate and rhythm, no murmur appreciated Abd soft, nontender, positive bowel sounds, no liver  or spleen tip palpated on exam, no fluid wave  MSK no focal spinal tenderness, no joint edema Neuro: non-focal, well-oriented, appropriate affect Breasts: No changes on exam. Lumpectomy scar around right areola intact. No mass, lesion or rash noted.   Lab Results  Component Value Date   WBC 3.2 (L) 04/26/2017   HGB 14.3 04/26/2017   HCT 41.5 04/26/2017   MCV 91 04/26/2017   PLT 225 04/26/2017   No results found for: FERRITIN, IRON, TIBC, UIBC, IRONPCTSAT Lab Results  Component Value Date   RBC 4.58 04/26/2017   No results found for: KPAFRELGTCHN, LAMBDASER, KAPLAMBRATIO No results found for: IGGSERUM, IGA, IGMSERUM No results found for: Odetta Pink, SPEI   Chemistry      Component Value Date/Time   NA 145 04/26/2017 0859   NA 141 04/08/2016 0815   K 4.0 04/26/2017 0859   K 4.2 04/08/2016 0815   CL 103 04/26/2017 0859   CO2 31 04/26/2017 0859   CO2 27 04/08/2016 0815   BUN 9 04/26/2017 0859   BUN 16.3 04/08/2016 0815   CREATININE 1.0 04/26/2017 0859   CREATININE 1.0 04/08/2016 0815      Component Value Date/Time   CALCIUM 9.5 04/26/2017 0859   CALCIUM 9.4 04/08/2016 0815   ALKPHOS 74 04/26/2017 0859   ALKPHOS 76 04/08/2016 0815   AST 24 04/26/2017 0859  AST 15 04/08/2016 0815   ALT 18 04/26/2017 0859   ALT <9 04/08/2016 0815   BILITOT 1.00 04/26/2017 0859   BILITOT 0.70 04/08/2016 0815      Impression and Plan: [name] is a very pleasant 48 yo perimenopausal caucasian female with history of ductal carcinoma in situ or the right breast with lumpectomy. She continues to do well and has no complaints at this time.  Vit D is pending.  We will plan to see her back in another 6 months for follow-up. She will contact our office with any questions or concerns. We can certainly see her sooner if need be.     Laverna Peace, NP 4/8/201910:32 AM

## 2017-10-12 LAB — VITAMIN D 25 HYDROXY (VIT D DEFICIENCY, FRACTURES): VIT D 25 HYDROXY: 42.9 ng/mL (ref 30.0–100.0)

## 2017-11-04 IMAGING — MG DIGITAL DIAGNOSTIC UNILATERAL RIGHT MAMMOGRAM
3 series · 3 of 3 positions shown · non-contrast
Comparison: Previous exam(s).

CLINICAL DATA: Screening recall for right breast calcifications.

EXAM:
DIGITAL DIAGNOSTIC RIGHT MAMMOGRAM WITH CAD

[R CC]
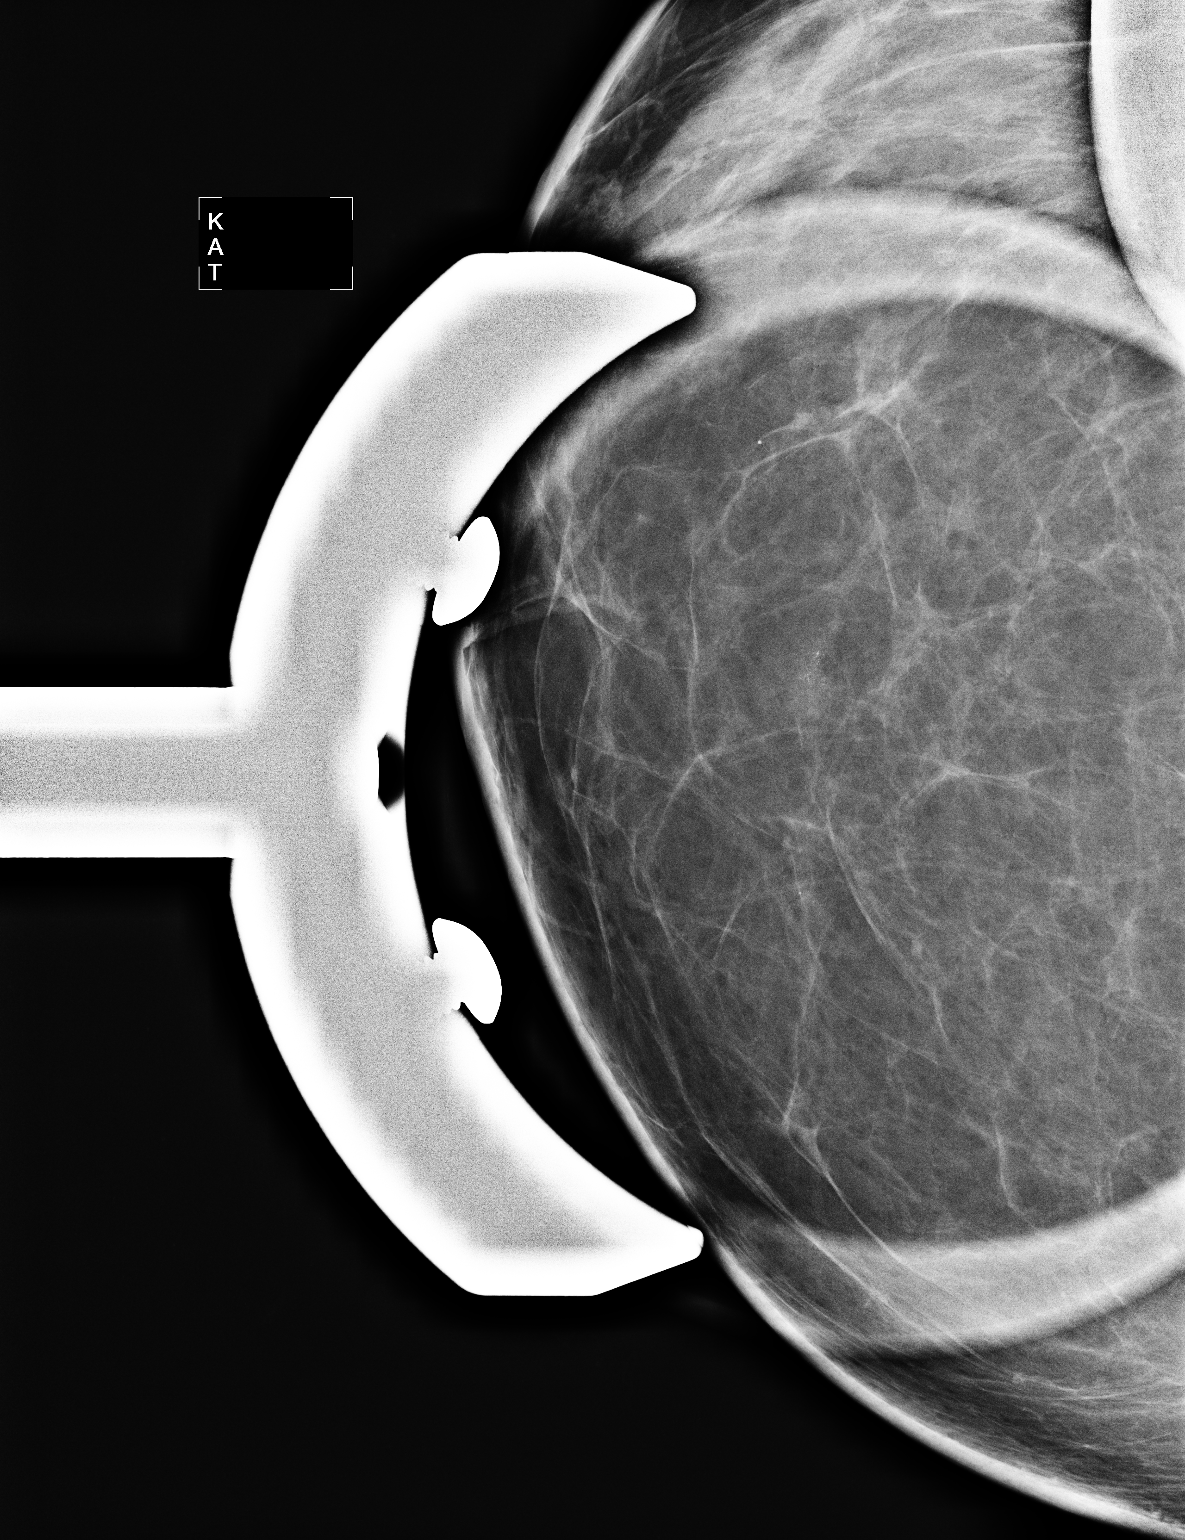

[R ML (1 of 2)]
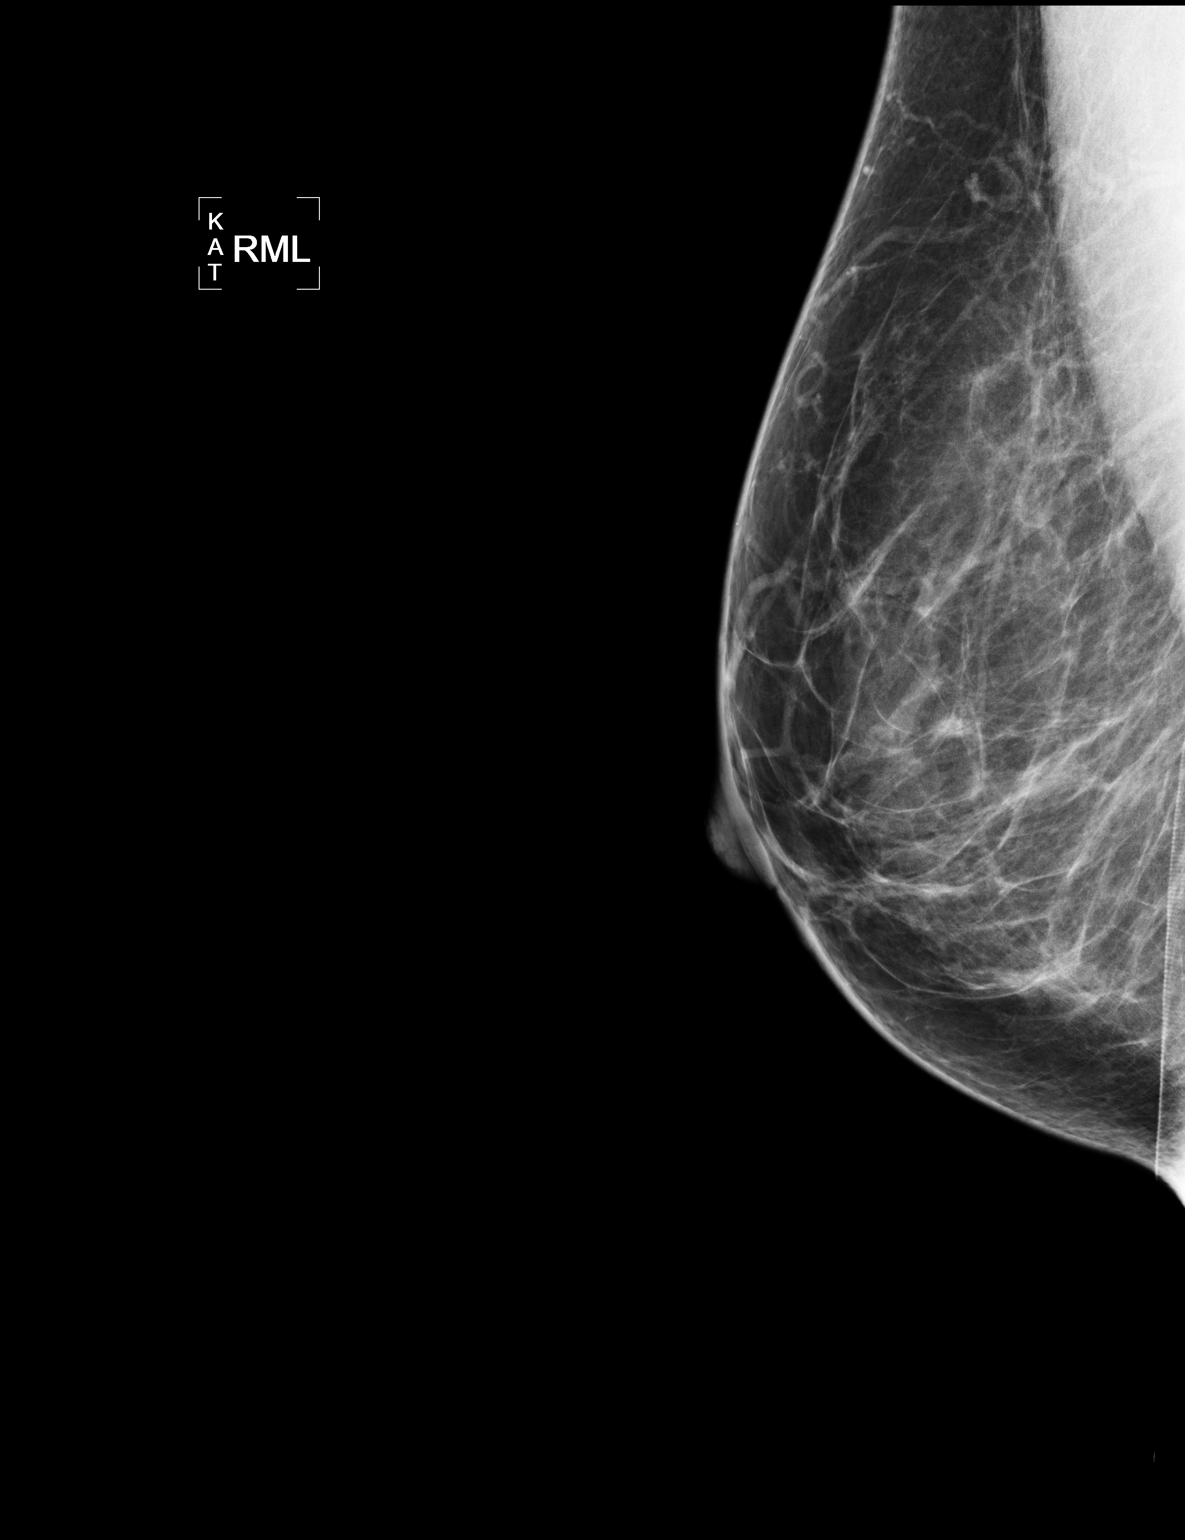

[R ML (2 of 2)]
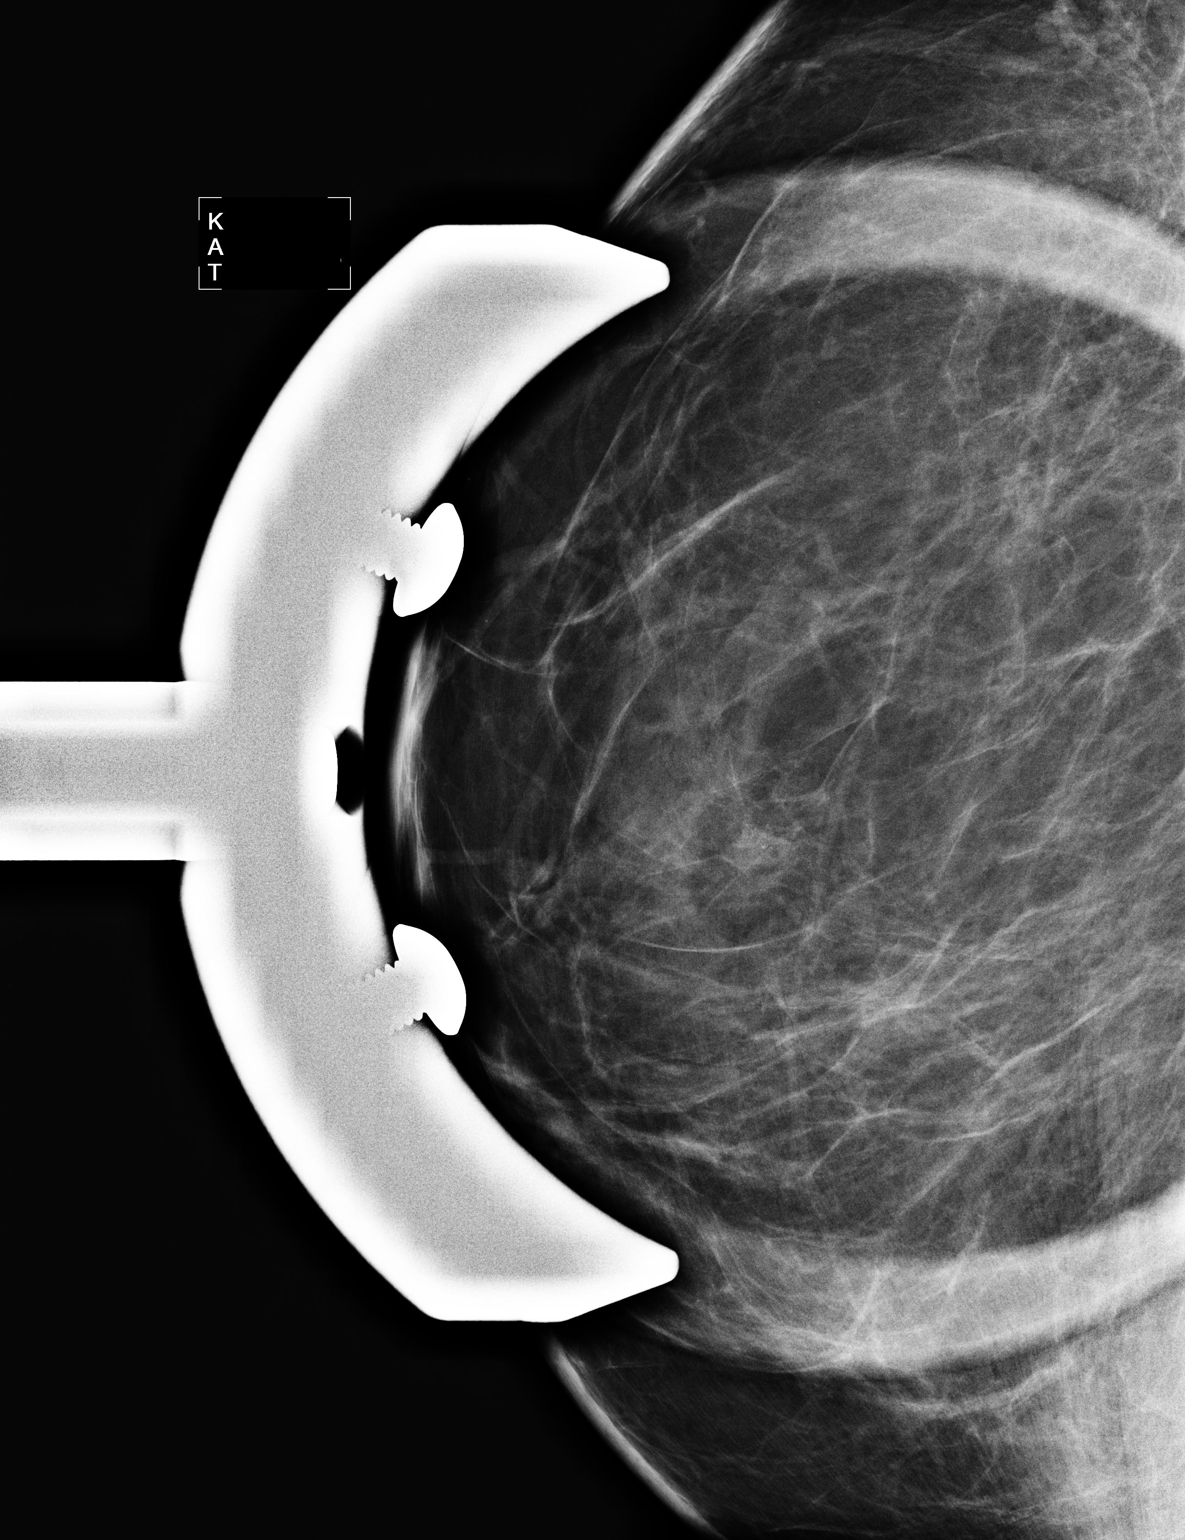

[3 of 3 positions shown; findings below may reference images not displayed]

ACR Breast Density Category b: There are scattered areas of
fibroglandular density.
FINDINGS: In the central to slightly medial right breast, middle depth there
is a 4 mm group of punctate and amorphous calcifications, best
visualized on the CC view.

Mammographic images were processed with CAD.
IMPRESSION: The 4 mm group of punctate and amorphous calcifications in the right
breast are indeterminate.

RECOMMENDATION:
Stereotactic right breast biopsy is recommended which has been
scheduled for 03/31/2016 at [DATE] a.m..

I have discussed the findings and recommendations with the patient.
Results were also provided in writing at the conclusion of the
visit. If applicable, a reminder letter will be sent to the patient
regarding the next appointment.

BI-RADS CATEGORY  4: Suspicious abnormality - biopsy should be
considered.

## 2018-04-11 ENCOUNTER — Inpatient Hospital Stay: Payer: BLUE CROSS/BLUE SHIELD

## 2018-04-11 ENCOUNTER — Inpatient Hospital Stay: Payer: BLUE CROSS/BLUE SHIELD | Attending: Family | Admitting: Family

## 2018-04-11 VITALS — BP 106/72 | HR 50 | Temp 97.7°F | Resp 17 | Wt 167.0 lb

## 2018-04-11 DIAGNOSIS — Z79899 Other long term (current) drug therapy: Secondary | ICD-10-CM | POA: Insufficient documentation

## 2018-04-11 DIAGNOSIS — Z86000 Personal history of in-situ neoplasm of breast: Secondary | ICD-10-CM | POA: Diagnosis present

## 2018-04-11 DIAGNOSIS — D0511 Intraductal carcinoma in situ of right breast: Secondary | ICD-10-CM

## 2018-04-11 DIAGNOSIS — E559 Vitamin D deficiency, unspecified: Secondary | ICD-10-CM

## 2018-04-11 DIAGNOSIS — Z171 Estrogen receptor negative status [ER-]: Principal | ICD-10-CM

## 2018-04-11 DIAGNOSIS — C50211 Malignant neoplasm of upper-inner quadrant of right female breast: Secondary | ICD-10-CM

## 2018-04-11 LAB — CMP (CANCER CENTER ONLY)
ALT: 15 U/L (ref 0–44)
ANION GAP: 9 (ref 5–15)
AST: 23 U/L (ref 15–41)
Albumin: 4.6 g/dL (ref 3.5–5.0)
Alkaline Phosphatase: 80 U/L (ref 38–126)
BUN: 15 mg/dL (ref 6–20)
CHLORIDE: 101 mmol/L (ref 98–111)
CO2: 30 mmol/L (ref 22–32)
Calcium: 9.4 mg/dL (ref 8.9–10.3)
Creatinine: 1.01 mg/dL — ABNORMAL HIGH (ref 0.44–1.00)
GFR, Estimated: >60 mL/min (ref >60)
Glucose, Bld: 96 mg/dL (ref 70–99)
Potassium: 4 mmol/L (ref 3.5–5.1)
SODIUM: 140 mmol/L (ref 135–145)
Total Bilirubin: 0.4 mg/dL (ref 0.3–1.2)
Total Protein: 8 g/dL (ref 6.5–8.1)

## 2018-04-11 LAB — CBC WITH DIFFERENTIAL (CANCER CENTER ONLY)
Basophils Absolute: 0 10*3/uL (ref 0.0–0.1)
Basophils Relative: 0 %
Eosinophils Absolute: 0.3 10*3/uL (ref 0.0–0.5)
Eosinophils Relative: 7 %
HEMATOCRIT: 42.5 % (ref 34.8–46.6)
HEMOGLOBIN: 14.5 g/dL (ref 11.6–15.9)
LYMPHS ABS: 1.6 10*3/uL (ref 0.9–3.3)
Lymphocytes Relative: 43 %
MCH: 30.9 pg (ref 26.0–34.0)
MCHC: 34.1 g/dL (ref 32.0–36.0)
MCV: 90.6 fL (ref 81.0–101.0)
MONOS PCT: 9 %
Monocytes Absolute: 0.3 10*3/uL (ref 0.1–0.9)
NEUTROS ABS: 1.5 10*3/uL (ref 1.5–6.5)
NEUTROS PCT: 41 %
Platelet Count: 192 10*3/uL (ref 145–400)
RBC: 4.69 MIL/uL (ref 3.70–5.32)
RDW: 12.4 % (ref 11.1–15.7)
WBC: 3.7 10*3/uL — AB (ref 3.9–10.0)

## 2018-04-11 NOTE — Progress Notes (Signed)
Hematology and Oncology Follow Up Visit  Anna Reid [id number] 1970-11-14 47 y.o. 04/11/2018   Principle Diagnosis:  Ductal carcinoma in situ of the right breast  Current Therapy:   Observation   Interim History:  Anna Reid is here today for follow-up. She is doing well and has no complaints at this time.  Breast exam today was negative. Mammogram due again in March.  No fever, chills, n/v, rash, dizziness, SOB, hot flashes, night sweats, chest pain, palpitations, abdominal pain or changes in bowel or bladder habits.  Cycle is still irregular. She has only had 2 cycles in the last year.  No other episodes of bleeding, no bruising or petechiae.  No swelling, tenderness, numbness or tingling in her extremities.  No lymphadenopathy noted on exam.  She has maintained a good appetite and is staying well hydrated.   ECOG Performance Status: 0 - Asymptomatic  Medications:  Allergies as of 04/11/2018      Reactions   Penicillins       Medication List        Accurate as of 04/11/18 10:01 AM. Always use your most recent med list.          cholecalciferol 400 units Tabs tablet Commonly known as:  VITAMIN D Take 2,000 Units by mouth.   METHYL B-12 PO Take 1,000 mcg by mouth as needed.       Allergies:  Allergies  Allergen Reactions  . Penicillins     Past Medical History, Surgical history, Social history, and Family History were reviewed and updated.  Review of Systems: All other 10 point review of systems is negative.   Physical Exam:  weight is 167 lb (75.8 kg). Her oral temperature is 97.7 F (36.5 C). Her blood pressure is 106/72 and her pulse is 50 (abnormal). Her respiration is 17 and oxygen saturation is 100%.   Wt Readings from Last 3 Encounters:  04/11/18 167 lb (75.8 kg)  10/11/17 166 lb (75.3 kg)  04/26/17 153 lb (69.4 kg)    Ocular: Sclerae unicteric, pupils equal, round and reactive to light Ear-nose-throat: Oropharynx clear, dentition  fair Lymphatic: No cervical, supraclavicular or axillary adenopathy Lungs no rales or rhonchi, good excursion bilaterally Heart regular rate and rhythm, no murmur appreciated Abd soft, nontender, positive bowel sounds, no liver or spleen tip palpated on exam, no fluid wave  MSK no focal spinal tenderness, no joint edema Neuro: non-focal, well-oriented, appropriate affect Breasts: No mass, lesion or rash noted  Lab Results  Component Value Date   WBC 3.7 (L) 04/11/2018   HGB 14.5 04/11/2018   HCT 42.5 04/11/2018   MCV 90.6 04/11/2018   PLT 192 04/11/2018   No results found for: FERRITIN, IRON, TIBC, UIBC, IRONPCTSAT Lab Results  Component Value Date   RBC 4.69 04/11/2018   No results found for: KPAFRELGTCHN, LAMBDASER, KAPLAMBRATIO No results found for: IGGSERUM, IGA, IGMSERUM No results found for: Anna Reid, SPEI   Chemistry      Component Value Date/Time   NA 139 10/11/2017 1024   NA 145 04/26/2017 0859   NA 141 04/08/2016 0815   K 3.9 10/11/2017 1024   K 4.0 04/26/2017 0859   K 4.2 04/08/2016 0815   CL 102 10/11/2017 1024   CL 103 04/26/2017 0859   CO2 28 10/11/2017 1024   CO2 31 04/26/2017 0859   CO2 27 04/08/2016 0815   BUN 11 10/11/2017 1024   BUN 9 04/26/2017 0859  BUN 16.3 04/08/2016 0815   CREATININE 0.97 10/11/2017 1024   CREATININE 1.0 04/26/2017 0859   CREATININE 1.0 04/08/2016 0815      Component Value Date/Time   CALCIUM 9.6 10/11/2017 1024   CALCIUM 9.5 04/26/2017 0859   CALCIUM 9.4 04/08/2016 0815   ALKPHOS 69 10/11/2017 1024   ALKPHOS 74 04/26/2017 0859   ALKPHOS 76 04/08/2016 0815   AST 19 10/11/2017 1024   AST 15 04/08/2016 0815   ALT 12 10/11/2017 1024   ALT 18 04/26/2017 0859   ALT <9 04/08/2016 0815   BILITOT 0.6 10/11/2017 1024   BILITOT 0.70 04/08/2016 0815      Impression and Plan: Anna Reid is a very pleasant 47 yo caucasian perimenopausal female with history of DCIS of  the right breast with lumpectomy in 2017. She continues to do well and has no complaints at this time. She would like to alternate visits with [place] and hr surgeon and only come to see each once a year. That will be fine, we will plan to see her back in another year.  Mammogram will be scheduled for in March.  She will contact our office with any questions or concerns. We can certainly see her sooner if need be.    Anna Peace, NP 10/7/201910:01 AM

## 2018-04-12 LAB — VITAMIN D 25 HYDROXY (VIT D DEFICIENCY, FRACTURES): Vit D, 25-Hydroxy: 50.9 ng/mL (ref 30.0–100.0)

## 2018-04-25 ENCOUNTER — Other Ambulatory Visit: Payer: Self-pay | Admitting: Obstetrics and Gynecology

## 2018-08-26 ENCOUNTER — Other Ambulatory Visit: Payer: Self-pay | Admitting: Family

## 2018-08-26 DIAGNOSIS — Z9889 Other specified postprocedural states: Secondary | ICD-10-CM

## 2018-10-17 ENCOUNTER — Ambulatory Visit: Payer: BLUE CROSS/BLUE SHIELD | Admitting: Hematology & Oncology

## 2018-10-17 ENCOUNTER — Other Ambulatory Visit: Payer: BLUE CROSS/BLUE SHIELD

## 2018-12-06 ENCOUNTER — Other Ambulatory Visit: Payer: Self-pay

## 2018-12-06 ENCOUNTER — Ambulatory Visit
Admission: RE | Admit: 2018-12-06 | Discharge: 2018-12-06 | Disposition: A | Discharge status: Home / Self Care | Payer: BC Managed Care – PPO | Source: Ambulatory Visit | Attending: Family | Admitting: Family

## 2018-12-06 DIAGNOSIS — Z9889 Other specified postprocedural states: Secondary | ICD-10-CM

## 2019-04-14 ENCOUNTER — Other Ambulatory Visit: Payer: Self-pay

## 2019-04-14 DIAGNOSIS — E559 Vitamin D deficiency, unspecified: Secondary | ICD-10-CM

## 2019-04-14 DIAGNOSIS — D0511 Intraductal carcinoma in situ of right breast: Secondary | ICD-10-CM

## 2019-04-17 ENCOUNTER — Inpatient Hospital Stay (HOSPITAL_BASED_OUTPATIENT_CLINIC_OR_DEPARTMENT_OTHER): Payer: BC Managed Care – PPO | Admitting: Hematology & Oncology

## 2019-04-17 ENCOUNTER — Other Ambulatory Visit: Payer: Self-pay

## 2019-04-17 ENCOUNTER — Inpatient Hospital Stay: Payer: BC Managed Care – PPO | Attending: Hematology & Oncology

## 2019-04-17 ENCOUNTER — Encounter: Payer: Self-pay | Admitting: Hematology & Oncology

## 2019-04-17 ENCOUNTER — Telehealth: Payer: Self-pay | Admitting: Hematology & Oncology

## 2019-04-17 VITALS — BP 102/68 | HR 52 | Temp 97.5°F | Resp 16 | Wt 172.0 lb

## 2019-04-17 DIAGNOSIS — D0511 Intraductal carcinoma in situ of right breast: Secondary | ICD-10-CM | POA: Diagnosis present

## 2019-04-17 DIAGNOSIS — E559 Vitamin D deficiency, unspecified: Secondary | ICD-10-CM

## 2019-04-17 DIAGNOSIS — C50211 Malignant neoplasm of upper-inner quadrant of right female breast: Secondary | ICD-10-CM | POA: Diagnosis not present

## 2019-04-17 LAB — CMP (CANCER CENTER ONLY)
ALT: 14 U/L (ref 0–44)
AST: 19 U/L (ref 15–41)
Albumin: 4.6 g/dL (ref 3.5–5.0)
Alkaline Phosphatase: 59 U/L (ref 38–126)
Anion gap: 7 (ref 5–15)
BUN: 13 mg/dL (ref 6–20)
CO2: 29 mmol/L (ref 22–32)
Calcium: 9.9 mg/dL (ref 8.9–10.3)
Chloride: 103 mmol/L (ref 98–111)
Creatinine: 0.93 mg/dL (ref 0.44–1.00)
GFR, Est AFR Am: >60 mL/min (ref >60)
GFR, Estimated: >60 mL/min (ref >60)
Glucose, Bld: 99 mg/dL (ref 70–99)
Potassium: 4.1 mmol/L (ref 3.5–5.1)
Sodium: 139 mmol/L (ref 135–145)
Total Bilirubin: 0.6 mg/dL (ref 0.3–1.2)
Total Protein: 7.3 g/dL (ref 6.5–8.1)

## 2019-04-17 LAB — CBC WITH DIFFERENTIAL (CANCER CENTER ONLY)
Abs Immature Granulocytes: 0.01 10*3/uL (ref 0.00–0.07)
Basophils Absolute: 0 10*3/uL (ref 0.0–0.1)
Basophils Relative: 0 %
Eosinophils Absolute: 0.2 10*3/uL (ref 0.0–0.5)
Eosinophils Relative: 4 %
HCT: 40.7 % (ref 36.0–46.0)
Hemoglobin: 13.7 g/dL (ref 12.0–15.0)
Immature Granulocytes: 0 %
Lymphocytes Relative: 45 %
Lymphs Abs: 1.7 10*3/uL (ref 0.7–4.0)
MCH: 29.8 pg (ref 26.0–34.0)
MCHC: 33.7 g/dL (ref 30.0–36.0)
MCV: 88.7 fL (ref 80.0–100.0)
Monocytes Absolute: 0.3 10*3/uL (ref 0.1–1.0)
Monocytes Relative: 8 %
Neutro Abs: 1.6 10*3/uL — ABNORMAL LOW (ref 1.7–7.7)
Neutrophils Relative %: 43 %
Platelet Count: 207 10*3/uL (ref 150–400)
RBC: 4.59 MIL/uL (ref 3.87–5.11)
RDW: 11.9 % (ref 11.5–15.5)
WBC Count: 3.8 10*3/uL — ABNORMAL LOW (ref 4.0–10.5)
nRBC: 0 % (ref 0.0–0.2)

## 2019-04-17 LAB — VITAMIN D 25 HYDROXY (VIT D DEFICIENCY, FRACTURES): Vit D, 25-Hydroxy: 54.11 ng/mL (ref 30–100)

## 2019-04-17 NOTE — Telephone Encounter (Signed)
Appointments scheduled patient declined avs/calendar per 10/12 los

## 2019-04-17 NOTE — Progress Notes (Signed)
Hematology and Oncology Follow Up Visit  Anna Reid [id number] 03/04/1971 48 y.o. 04/17/2019   Principle Diagnosis:  Ductal carcinoma in situ of the right breast  Current Therapy:   Observation   Interim History:  [name] is here today for follow-up.  So far, everything is going pretty well for her.  She really has had no specific complaints.  She she says that her monthly cycles are irregular.  She is not sure if she is going to go through the change of life.  I am sure that her gynecologist is managing all of this.  She has had no problems with fever.  There is been no change in bowel or bladder habits.  She has had no rashes.  She is still working.  Thankfully, she was not furloughed from the coronavirus outbreak.  She had a mammogram back in March.  Everything looked fine on the mammogram.  Overall, her performance status is ECOG 0.    Medications:  Allergies as of 04/17/2019      Reactions   Penicillins       Medication List       Accurate as of April 17, 2019 11:06 AM. If you have any questions, ask your nurse or doctor.        cholecalciferol 10 MCG (400 UNIT) Tabs tablet Commonly known as: VITAMIN D3 Take 2,000 Units by mouth.   METHYL B-12 PO Take 1,000 mcg by mouth as needed.       Allergies:  Allergies  Allergen Reactions  . Penicillins     Past Medical History, Surgical history, Social history, and Family History were reviewed and updated.  Review of Systems: Review of Systems  Constitutional: Negative.   HENT: Negative.   Eyes: Negative.   Respiratory: Negative.   Cardiovascular: Negative.   Gastrointestinal: Negative.   Genitourinary: Negative.   Musculoskeletal: Negative.   Skin: Negative.   Neurological: Negative.   Endo/Heme/Allergies: Negative.   Psychiatric/Behavioral: Negative.      Physical Exam:  weight is 172 lb (78 kg). Her temporal temperature is 97.5 F (36.4 C) (abnormal). Her blood pressure is 102/68 and  her pulse is 52 (abnormal). Her respiration is 16 and oxygen saturation is 100%.   Wt Readings from Last 3 Encounters:  04/17/19 172 lb (78 kg)  04/11/18 167 lb (75.8 kg)  10/11/17 166 lb (75.3 kg)    Physical Exam Vitals signs reviewed.  Constitutional:      Comments: Some slight decreased contraction of the breast.  She has a well-healed lumpectomy at the areola at about the 2 o'clock position.  There is no distinct mass in the right breast.  There is no right axillary adenopathy.  HENT:     Head: Normocephalic and atraumatic.  Eyes:     Pupils: Pupils are equal, round, and reactive to light.  Neck:     Musculoskeletal: Normal range of motion.  Cardiovascular:     Rate and Rhythm: Normal rate and regular rhythm.     Heart sounds: Normal heart sounds.  Pulmonary:     Effort: Pulmonary effort is normal.     Breath sounds: Normal breath sounds.  Abdominal:     General: Bowel sounds are normal.     Palpations: Abdomen is soft.  Musculoskeletal: Normal range of motion.        General: No tenderness or deformity.  Lymphadenopathy:     Cervical: No cervical adenopathy.  Skin:    General: Skin is warm and dry.  Findings: No erythema or rash.  Neurological:     Mental Status: She is alert and oriented to person, place, and time.  Psychiatric:        Behavior: Behavior normal.        Thought Content: Thought content normal.        Judgment: Judgment normal.    Breast exam shows left breast with no masses, edema or erythema.  There is no left axillary adenopathy.  Right breast shows  Lab Results  Component Value Date   WBC 3.8 (L) 04/17/2019   HGB 13.7 04/17/2019   HCT 40.7 04/17/2019   MCV 88.7 04/17/2019   PLT 207 04/17/2019   No results found for: FERRITIN, IRON, TIBC, UIBC, IRONPCTSAT Lab Results  Component Value Date   RBC 4.59 04/17/2019   No results found for: KPAFRELGTCHN, LAMBDASER, KAPLAMBRATIO No results found for: IGGSERUM, IGA, IGMSERUM No results  found for: Kathrynn Ducking, MSPIKE, SPEI   Chemistry      Component Value Date/Time   NA 139 04/17/2019 0930   NA 145 04/26/2017 0859   NA 141 04/08/2016 0815   K 4.1 04/17/2019 0930   K 4.0 04/26/2017 0859   K 4.2 04/08/2016 0815   CL 103 04/17/2019 0930   CL 103 04/26/2017 0859   CO2 29 04/17/2019 0930   CO2 31 04/26/2017 0859   CO2 27 04/08/2016 0815   BUN 13 04/17/2019 0930   BUN 9 04/26/2017 0859   BUN 16.3 04/08/2016 0815   CREATININE 0.93 04/17/2019 0930   CREATININE 1.0 04/26/2017 0859   CREATININE 1.0 04/08/2016 0815      Component Value Date/Time   CALCIUM 9.9 04/17/2019 0930   CALCIUM 9.5 04/26/2017 0859   CALCIUM 9.4 04/08/2016 0815   ALKPHOS 59 04/17/2019 0930   ALKPHOS 74 04/26/2017 0859   ALKPHOS 76 04/08/2016 0815   AST 19 04/17/2019 0930   AST 15 04/08/2016 0815   ALT 14 04/17/2019 0930   ALT 18 04/26/2017 0859   ALT <9 04/08/2016 0815   BILITOT 0.6 04/17/2019 0930   BILITOT 0.70 04/08/2016 0815      Impression and Plan: [name] is a very pleasant 48 yo caucasian perimenopausal female with history of DCIS of the right breast with lumpectomy in 2017.  So far, everything is going quite well.  I do not see anything that looks like progressive disease or recurrent disease.  We will plan to get her back in another year.  Hopefully, everything will go well with HER-2 boys.  I know they are having a tough time not having school.  One is in college and one is in high school.  This has had a stress to [name].  I must say that she is handling this incredibly well.    Volanda Napoleon, MD 10/12/202011:06 AM

## 2020-01-31 ENCOUNTER — Other Ambulatory Visit: Payer: Self-pay | Admitting: Hematology & Oncology

## 2020-01-31 DIAGNOSIS — Z9889 Other specified postprocedural states: Secondary | ICD-10-CM

## 2020-02-23 ENCOUNTER — Other Ambulatory Visit: Payer: Self-pay

## 2020-02-23 ENCOUNTER — Ambulatory Visit
Admission: RE | Admit: 2020-02-23 | Discharge: 2020-02-23 | Disposition: A | Discharge status: Home / Self Care | Payer: BC Managed Care – PPO | Source: Ambulatory Visit | Attending: Hematology & Oncology | Admitting: Hematology & Oncology

## 2020-02-23 DIAGNOSIS — Z9889 Other specified postprocedural states: Secondary | ICD-10-CM

## 2020-04-16 ENCOUNTER — Inpatient Hospital Stay (HOSPITAL_BASED_OUTPATIENT_CLINIC_OR_DEPARTMENT_OTHER): Payer: BC Managed Care – PPO | Admitting: Hematology & Oncology

## 2020-04-16 ENCOUNTER — Encounter: Payer: Self-pay | Admitting: Hematology & Oncology

## 2020-04-16 ENCOUNTER — Inpatient Hospital Stay: Payer: BC Managed Care – PPO | Attending: Hematology & Oncology

## 2020-04-16 ENCOUNTER — Other Ambulatory Visit: Payer: Self-pay

## 2020-04-16 VITALS — BP 124/83 | HR 60 | Temp 98.5°F | Resp 16 | Ht 62.5 in | Wt 187.0 lb

## 2020-04-16 DIAGNOSIS — C50211 Malignant neoplasm of upper-inner quadrant of right female breast: Secondary | ICD-10-CM | POA: Diagnosis not present

## 2020-04-16 DIAGNOSIS — Z86000 Personal history of in-situ neoplasm of breast: Secondary | ICD-10-CM | POA: Diagnosis present

## 2020-04-16 DIAGNOSIS — Z170 Estrogen receptor positive status [ER+]: Secondary | ICD-10-CM

## 2020-04-16 LAB — CBC WITH DIFFERENTIAL (CANCER CENTER ONLY)
Abs Immature Granulocytes: 0.02 10*3/uL (ref 0.00–0.07)
Basophils Absolute: 0 10*3/uL (ref 0.0–0.1)
Basophils Relative: 1 %
Eosinophils Absolute: 0.2 10*3/uL (ref 0.0–0.5)
Eosinophils Relative: 4 %
HCT: 44.6 % (ref 36.0–46.0)
Hemoglobin: 15.1 g/dL — ABNORMAL HIGH (ref 12.0–15.0)
Immature Granulocytes: 1 %
Lymphocytes Relative: 42 %
Lymphs Abs: 1.8 10*3/uL (ref 0.7–4.0)
MCH: 30.6 pg (ref 26.0–34.0)
MCHC: 33.9 g/dL (ref 30.0–36.0)
MCV: 90.3 fL (ref 80.0–100.0)
Monocytes Absolute: 0.4 10*3/uL (ref 0.1–1.0)
Monocytes Relative: 9 %
Neutro Abs: 1.8 10*3/uL (ref 1.7–7.7)
Neutrophils Relative %: 43 %
Platelet Count: 238 10*3/uL (ref 150–400)
RBC: 4.94 MIL/uL (ref 3.87–5.11)
RDW: 12.5 % (ref 11.5–15.5)
WBC Count: 4.2 10*3/uL (ref 4.0–10.5)
nRBC: 0 % (ref 0.0–0.2)

## 2020-04-16 LAB — CMP (CANCER CENTER ONLY)
ALT: 14 U/L (ref 0–44)
AST: 18 U/L (ref 15–41)
Albumin: 4.7 g/dL (ref 3.5–5.0)
Alkaline Phosphatase: 70 U/L (ref 38–126)
Anion gap: 8 (ref 5–15)
BUN: 24 mg/dL — ABNORMAL HIGH (ref 6–20)
CO2: 29 mmol/L (ref 22–32)
Calcium: 10.2 mg/dL (ref 8.9–10.3)
Chloride: 103 mmol/L (ref 98–111)
Creatinine: 1.13 mg/dL — ABNORMAL HIGH (ref 0.44–1.00)
GFR, Estimated: 57 mL/min — ABNORMAL LOW (ref >60)
Glucose, Bld: 99 mg/dL (ref 70–99)
Potassium: 4.1 mmol/L (ref 3.5–5.1)
Sodium: 140 mmol/L (ref 135–145)
Total Bilirubin: 0.6 mg/dL (ref 0.3–1.2)
Total Protein: 7.4 g/dL (ref 6.5–8.1)

## 2020-04-16 NOTE — Progress Notes (Signed)
Hematology and Oncology Follow Up Visit  Anna Reid [id number] 05-May-1971 49 y.o. 04/16/2020   Principle Diagnosis:  Ductal carcinoma in situ of the right breast  Current Therapy:   Observation   Interim History:  Anna Reid is here today for follow-up.  We see her yearly.  Since we last saw her, she has been doing okay.  She is still working.  She is quite busy with work.  She went out to [facility], in [place] this summer for a week or so.  She had a good time.  She enjoyed going out there.  She went with a friend.  She has had no problems with the COVID.  She and her 2 sons have had vaccinations.  She has had no problems with hot flashes.  She has had no issues with nausea or vomiting.  She has had no change in bowel or bladder habits.  Currently, her performance status is ECOG 1.   Medications:  Allergies as of 04/16/2020      Reactions   Penicillins       Medication List       Accurate as of April 16, 2020 10:21 AM. If you have any questions, ask your nurse or doctor.        cholecalciferol 10 MCG (400 UNIT) Tabs tablet Commonly known as: VITAMIN D3 Take 2,000 Units by mouth.   METHYL B-12 PO Take 1,000 mcg by mouth as needed.   NON FORMULARY Take 2 capsules by mouth daily. Doterra Veomega   NON FORMULARY Take 2 capsules by mouth daily. Doterra Bone Nutrients Complex   NON FORMULARY Take 2 capsules by mouth daily. Doterra Alpha CRS Plus   NON FORMULARY Take 2 capsules by mouth daily. Doterra Microplex VMZ Food Nutrients Complex   VITAMIN C PO Take 1,000 mg by mouth daily.   Zinc 50 MG Caps Take 50 mg by mouth daily.       Allergies:  Allergies  Allergen Reactions  . Penicillins     Past Medical History, Surgical history, Social history, and Family History were reviewed and updated.  Review of Systems: Review of Systems  Constitutional: Negative.   HENT: Negative.   Eyes: Negative.   Respiratory: Negative.   Cardiovascular:  Negative.   Gastrointestinal: Negative.   Genitourinary: Negative.   Musculoskeletal: Negative.   Skin: Negative.   Neurological: Negative.   Endo/Heme/Allergies: Negative.   Psychiatric/Behavioral: Negative.      Physical Exam:  height is 5' 2.5" (1.588 m) and weight is 187 lb (84.8 kg). Her oral temperature is 98.5 F (36.9 C). Her blood pressure is 124/83 and her pulse is 60. Her respiration is 16.   Wt Readings from Last 3 Encounters:  04/16/20 187 lb (84.8 kg)  04/17/19 172 lb (78 kg)  04/11/18 167 lb (75.8 kg)    Physical Exam Vitals reviewed.  Constitutional:      Comments: Some slight decreased contraction of the breast.  She has a well-healed lumpectomy at the areola at about the 2 o'clock position.  There is no distinct mass in the right breast.  There is no right axillary adenopathy.  HENT:     Head: Normocephalic and atraumatic.  Eyes:     Pupils: Pupils are equal, round, and reactive to light.  Cardiovascular:     Rate and Rhythm: Normal rate and regular rhythm.     Heart sounds: Normal heart sounds.  Pulmonary:     Effort: Pulmonary effort is normal.  Breath sounds: Normal breath sounds.  Abdominal:     General: Bowel sounds are normal.     Palpations: Abdomen is soft.  Musculoskeletal:        General: No tenderness or deformity. Normal range of motion.     Cervical back: Normal range of motion.  Lymphadenopathy:     Cervical: No cervical adenopathy.  Skin:    General: Skin is warm and dry.     Findings: No erythema or rash.  Neurological:     Mental Status: She is alert and oriented to person, place, and time.  Psychiatric:        Behavior: Behavior normal.        Thought Content: Thought content normal.        Judgment: Judgment normal.    Breast exam shows left breast with no masses, edema or erythema.  There is no left axillary adenopathy.  Right breast shows  Lab Results  Component Value Date   WBC 4.2 04/16/2020   HGB 15.1 (H)  04/16/2020   HCT 44.6 04/16/2020   MCV 90.3 04/16/2020   PLT 238 04/16/2020   No results found for: FERRITIN, IRON, TIBC, UIBC, IRONPCTSAT Lab Results  Component Value Date   RBC 4.94 04/16/2020   No results found for: KPAFRELGTCHN, LAMBDASER, KAPLAMBRATIO No results found for: IGGSERUM, IGA, IGMSERUM No results found for: Kathrynn Ducking, MSPIKE, SPEI   Chemistry      Component Value Date/Time   NA 140 04/16/2020 0932   NA 145 04/26/2017 0859   NA 141 04/08/2016 0815   K 4.1 04/16/2020 0932   K 4.0 04/26/2017 0859   K 4.2 04/08/2016 0815   CL 103 04/16/2020 0932   CL 103 04/26/2017 0859   CO2 29 04/16/2020 0932   CO2 31 04/26/2017 0859   CO2 27 04/08/2016 0815   BUN 24 (H) 04/16/2020 0932   BUN 9 04/26/2017 0859   BUN 16.3 04/08/2016 0815   CREATININE 1.13 (H) 04/16/2020 0932   CREATININE 1.0 04/26/2017 0859   CREATININE 1.0 04/08/2016 0815      Component Value Date/Time   CALCIUM 10.2 04/16/2020 0932   CALCIUM 9.5 04/26/2017 0859   CALCIUM 9.4 04/08/2016 0815   ALKPHOS 70 04/16/2020 0932   ALKPHOS 74 04/26/2017 0859   ALKPHOS 76 04/08/2016 0815   AST 18 04/16/2020 0932   AST 15 04/08/2016 0815   ALT 14 04/16/2020 0932   ALT 18 04/26/2017 0859   ALT <9 04/08/2016 0815   BILITOT 0.6 04/16/2020 0932   BILITOT 0.70 04/08/2016 0815      Impression and Plan: Anna Reid is a very pleasant 49 yo caucasian perimenopausal female with history of DCIS of the right breast with lumpectomy in 2017.  So far, everything is going quite well.  I do not see anything that looks like progressive disease or recurrent disease.  We will plan to get her back in another year.  Volanda Napoleon, MD 10/12/202110:21 AM

## 2021-01-17 ENCOUNTER — Other Ambulatory Visit: Payer: Self-pay | Admitting: Hematology & Oncology

## 2021-01-17 DIAGNOSIS — Z853 Personal history of malignant neoplasm of breast: Secondary | ICD-10-CM

## 2021-04-16 ENCOUNTER — Inpatient Hospital Stay: Payer: BC Managed Care – PPO | Attending: Hematology & Oncology | Admitting: Hematology & Oncology

## 2021-04-16 ENCOUNTER — Other Ambulatory Visit: Payer: Self-pay

## 2021-04-16 ENCOUNTER — Inpatient Hospital Stay: Payer: BC Managed Care – PPO

## 2021-04-16 ENCOUNTER — Encounter: Payer: Self-pay | Admitting: Hematology & Oncology

## 2021-04-16 VITALS — BP 104/71 | HR 60 | Temp 98.0°F | Resp 18 | Ht 62.0 in | Wt 176.8 lb

## 2021-04-16 DIAGNOSIS — C50211 Malignant neoplasm of upper-inner quadrant of right female breast: Secondary | ICD-10-CM

## 2021-04-16 DIAGNOSIS — Z86000 Personal history of in-situ neoplasm of breast: Secondary | ICD-10-CM | POA: Insufficient documentation

## 2021-04-16 LAB — CBC WITH DIFFERENTIAL (CANCER CENTER ONLY)
Abs Immature Granulocytes: 0.02 10*3/uL (ref 0.00–0.07)
Basophils Absolute: 0 10*3/uL (ref 0.0–0.1)
Basophils Relative: 1 %
Eosinophils Absolute: 0.2 10*3/uL (ref 0.0–0.5)
Eosinophils Relative: 5 %
HCT: 41.9 % (ref 36.0–46.0)
Hemoglobin: 14.4 g/dL (ref 12.0–15.0)
Immature Granulocytes: 1 %
Lymphocytes Relative: 41 %
Lymphs Abs: 1.6 10*3/uL (ref 0.7–4.0)
MCH: 31 pg (ref 26.0–34.0)
MCHC: 34.4 g/dL (ref 30.0–36.0)
MCV: 90.1 fL (ref 80.0–100.0)
Monocytes Absolute: 0.4 10*3/uL (ref 0.1–1.0)
Monocytes Relative: 10 %
Neutro Abs: 1.6 10*3/uL — ABNORMAL LOW (ref 1.7–7.7)
Neutrophils Relative %: 42 %
Platelet Count: 201 10*3/uL (ref 150–400)
RBC: 4.65 MIL/uL (ref 3.87–5.11)
RDW: 12.8 % (ref 11.5–15.5)
WBC Count: 3.8 10*3/uL — ABNORMAL LOW (ref 4.0–10.5)
nRBC: 0 % (ref 0.0–0.2)

## 2021-04-16 LAB — CMP (CANCER CENTER ONLY)
ALT: 18 U/L (ref 0–44)
AST: 23 U/L (ref 15–41)
Albumin: 4.5 g/dL (ref 3.5–5.0)
Alkaline Phosphatase: 79 U/L (ref 38–126)
Anion gap: 8 (ref 5–15)
BUN: 29 mg/dL — ABNORMAL HIGH (ref 6–20)
CO2: 30 mmol/L (ref 22–32)
Calcium: 9.9 mg/dL (ref 8.9–10.3)
Chloride: 101 mmol/L (ref 98–111)
Creatinine: 1.12 mg/dL — ABNORMAL HIGH (ref 0.44–1.00)
GFR, Estimated: 60 mL/min — ABNORMAL LOW (ref >60)
Glucose, Bld: 96 mg/dL (ref 70–99)
Potassium: 4.1 mmol/L (ref 3.5–5.1)
Sodium: 139 mmol/L (ref 135–145)
Total Bilirubin: 0.7 mg/dL (ref 0.3–1.2)
Total Protein: 7 g/dL (ref 6.5–8.1)

## 2021-04-16 LAB — VITAMIN D 25 HYDROXY (VIT D DEFICIENCY, FRACTURES): Vit D, 25-Hydroxy: 50.28 ng/mL (ref 30–100)

## 2021-04-16 NOTE — Progress Notes (Signed)
Hematology and Oncology Follow Up Visit  Anna Reid [id number] [birth year] 50 y.o. 04/16/2021   Principle Diagnosis:  Ductal carcinoma in situ of the right breast  Current Therapy:   Observation   Interim History:  Ms. Barbier is here today for follow-up.  We see her yearly.  As always, she is busy.  She is working.  She had a nice year so far.  There is been no problems with COVID.  Her 2 sons are in college.  They seem to be doing pretty well.  Para she did go to the [employer] couple weeks ago.  She had a good time while she was up there.  She did have a mammogram recently.  This was done on 02/23/2020.  There is no evidence of recurrent breast cancer.  She had post operative changes on the right side.  She has had no change in bowel or bladder habits.  There is been no leg swelling.  She has had no rashes..  She is starting to go back to the gym.  She is exercising.  Overall, I would say her performance status is ECOG 0.    Medications:  Allergies as of 04/16/2021       Reactions   Penicillins Other (See Comments)   Unknown Reaction        Medication List        Accurate as of April 16, 2021  8:10 AM. If you have any questions, ask your nurse or doctor.          cholecalciferol 10 MCG (400 UNIT) Tabs tablet Commonly known as: VITAMIN D3 Take 2,000 Units by mouth.   METHYL B-12 PO Take 1,000 mcg by mouth as needed.   NON FORMULARY Take 2 capsules by mouth daily. Doterra Veomega   NON FORMULARY Take 2 capsules by mouth daily. Doterra Bone Nutrients Complex   NON FORMULARY Take 2 capsules by mouth daily. Doterra Alpha CRS Plus   NON FORMULARY Take 2 capsules by mouth daily. Doterra Microplex VMZ Food Nutrients Complex   VITAMIN C PO Take 1,000 mg by mouth daily.   Zinc 50 MG Caps Take 50 mg by mouth daily.        Allergies:  Allergies  Allergen Reactions   Penicillins Other (See Comments)    Unknown Reaction    Past Medical  History, Surgical history, Social history, and Family History were reviewed and updated.  Review of Systems: Review of Systems  Constitutional: Negative.   HENT: Negative.    Eyes: Negative.   Respiratory: Negative.    Cardiovascular: Negative.   Gastrointestinal: Negative.   Genitourinary: Negative.   Musculoskeletal: Negative.   Skin: Negative.   Neurological: Negative.   Endo/Heme/Allergies: Negative.   Psychiatric/Behavioral: Negative.      Physical Exam:  height is 5\' 2"  (1.575 m) and weight is 176 lb 12.8 oz (80.2 kg). Her oral temperature is 98 F (36.7 C). Her blood pressure is 104/71 and her pulse is 60. Her respiration is 18 and oxygen saturation is 99%.   Wt Readings from Last 3 Encounters:  04/16/21 176 lb 12.8 oz (80.2 kg)  04/16/20 187 lb (84.8 kg)  04/17/19 172 lb (78 kg)    Physical Exam Vitals reviewed.  Constitutional:      Comments: Some slight decreased contraction of the breast.  She has a well-healed lumpectomy at the areola at about the 2 o'clock position.  There is no distinct mass in the right breast.  There is  no right axillary adenopathy.  HENT:     Head: Normocephalic and atraumatic.  Eyes:     Pupils: Pupils are equal, round, and reactive to light.  Cardiovascular:     Rate and Rhythm: Normal rate and regular rhythm.     Heart sounds: Normal heart sounds.  Pulmonary:     Effort: Pulmonary effort is normal.     Breath sounds: Normal breath sounds.  Abdominal:     General: Bowel sounds are normal.     Palpations: Abdomen is soft.  Musculoskeletal:        General: No tenderness or deformity. Normal range of motion.     Cervical back: Normal range of motion.  Lymphadenopathy:     Cervical: No cervical adenopathy.  Skin:    General: Skin is warm and dry.     Findings: No erythema or rash.  Neurological:     Mental Status: She is alert and oriented to person, place, and time.  Psychiatric:        Behavior: Behavior normal.        Thought  Content: Thought content normal.        Judgment: Judgment normal.   Breast exam shows left breast with no masses, edema or erythema.  There is no left axillary adenopathy.  Right breast shows  Lab Results  Component Value Date   WBC 3.8 (L) 04/16/2021   HGB 14.4 04/16/2021   HCT 41.9 04/16/2021   MCV 90.1 04/16/2021   PLT 201 04/16/2021   No results found for: FERRITIN, IRON, TIBC, UIBC, IRONPCTSAT Lab Results  Component Value Date   RBC 4.65 04/16/2021   No results found for: KPAFRELGTCHN, LAMBDASER, KAPLAMBRATIO No results found for: IGGSERUM, IGA, IGMSERUM No results found for: Kathrynn Ducking, MSPIKE, SPEI   Chemistry      Component Value Date/Time   NA 140 04/16/2020 0932   NA 145 04/26/2017 0859   NA 141 04/08/2016 0815   K 4.1 04/16/2020 0932   K 4.0 04/26/2017 0859   K 4.2 04/08/2016 0815   CL 103 04/16/2020 0932   CL 103 04/26/2017 0859   CO2 29 04/16/2020 0932   CO2 31 04/26/2017 0859   CO2 27 04/08/2016 0815   BUN 24 (H) 04/16/2020 0932   BUN 9 04/26/2017 0859   BUN 16.3 04/08/2016 0815   CREATININE 1.13 (H) 04/16/2020 0932   CREATININE 1.0 04/26/2017 0859   CREATININE 1.0 04/08/2016 0815      Component Value Date/Time   CALCIUM 10.2 04/16/2020 0932   CALCIUM 9.5 04/26/2017 0859   CALCIUM 9.4 04/08/2016 0815   ALKPHOS 70 04/16/2020 0932   ALKPHOS 74 04/26/2017 0859   ALKPHOS 76 04/08/2016 0815   AST 18 04/16/2020 0932   AST 15 04/08/2016 0815   ALT 14 04/16/2020 0932   ALT 18 04/26/2017 0859   ALT <9 04/08/2016 0815   BILITOT 0.6 04/16/2020 0932   BILITOT 0.70 04/08/2016 0815      Impression and Plan: Ms. Dobosz is a very pleasant 50 yo caucasian perimenopausal female with history of DCIS of the right breast with lumpectomy in 2017.  Has now been 5 years.  I think we can let her go from the clinic.  She has great follow-up with her family doctor.  I am just happy that she is done so well.  I know  she will continue to exercise.  It certainly was a pleasure seeing her in talking with her. Marland Kitchen  Volanda Napoleon, MD 10/12/20228:10 AM

## 2021-04-17 ENCOUNTER — Telehealth: Payer: Self-pay | Admitting: Hematology & Oncology

## 2021-04-17 NOTE — Telephone Encounter (Signed)
NO los 10/12
